# Patient Record
Sex: Female | Born: 1985 | Race: White | Hispanic: No | Marital: Single
Health system: Southern US, Community
[De-identification: ages and names within clinical notes are randomized; demographics above are authoritative.]

## PROBLEM LIST (undated history)

## (undated) HISTORY — PX: OVARY SURGERY: SHX727

## (undated) NOTE — Progress Notes (Signed)
 Formatting of this note is different from the original.  Subjective   Patient ID: Cheryl [name] is a 60 y.o. female presenting to the Urgent Care with a chief complaint of Vomiting (Patient states she is vomiting x 1 day ).    PT STATES THAT SHE HAS BEEN VOMITING FOR THE PAST FEW HOURS. PT HAS VOMITED 3 TIMES AND NEEDS A WORK EXCUSE.     Vomiting    Objective   Vitals:    03/06/24 1717   Weight: 109 kg (240 lb)   Height: 1.727 m (5' 8)     No results found.   Vital signs reviewed.    Physical Exam  HENT:      Head: Normocephalic.      Nose: Nose normal.   Eyes:      Extraocular Movements: Extraocular movements intact.   Musculoskeletal:      Cervical back: Normal range of motion.   Skin:     General: Skin is warm.   Neurological:      General: No focal deficit present.      Mental Status: She is alert.   Psychiatric:         Mood and Affect: Mood normal.     Telehealth Encounter:  This encounter was completed virtually using a secure, synchronous, interactive audio-video session via [employer] telehealth platform.  The physical exam was limited to observational findings.  Patient Location:  Patient at home or other non-healthcare setting in the state of State: [place]   Provider Location:  In clinic in the state of State: [place]   Care provided during virtual visit.  Patient or guardian consented to such, understanding limitations and alternatives.  Patient name, date of birth, and location were verified prior to visit.  Patient is currently in a location in which I currently hold an active license.  Follow-up with patient's provider or our clinic as needed for ongoing concerns.      Assessment & Plan  HAVE ADVISED PT TO GO TO THE ER FOR FURTHER EVALUATION.  Assessment & Plan        In-House Lab Results:     Results for orders placed or performed in visit on 01/19/24   POCT Strep    Collection Time: 01/19/24  4:42 PM   Result Value Ref Range    Rapid Strep A Screen Negative Negative   POCT Covid Antigen     Collection Time: 01/19/24  4:49 PM   Result Value Ref Range    POCT Covid Antigen Negative Negative       In-House Imaging Reads:        Procedure Documentation:  Procedures     ED Course & MDM   MDM - Medical Decision Making: Transfer to ED via private transport. Patient is medically stable for transport without medical supervision.   Electronically signed by Farrah Zahir, PA-C at 03/06/2024  5:41 PM EST

## (undated) NOTE — Progress Notes (Signed)
 Formatting of this note is different from the original.  Subjective   Patient ID: Cheryl [name] is a 76 y.o. female presenting to the Urgent Care virtually with a chief complaint of Eye Problem (X4 days redness both eyes ).    Burning, itching, watery discharge and redness of left eye for about 4 days. Similar symptoms in right eye today as well. Lump under left upper eyelid for 2 days. No injury or chemical exposure to either eye. No blurry vision or double vision. No foreign body sensation in either eye. No treatment so far.    Objective   Vitals:    03/27/24 1830   Weight: 109 kg (240 lb)  Comment: verbal   Height: 1.727 m (5' 8)  Comment: verbal   BMI (Calculated): 36.5 kg/m2   BSA (Calculated - sq m): 2.29 sq meters     OB Vitals  LMP 03/13/2024   OB Status Having periods     Social History     Tobacco Use   Smoking Status Never    Passive exposure: Never   Smokeless Tobacco Never       Vital signs reviewed.    Physical Exam  Constitutional:       General: She is not in acute distress.     Appearance: Normal appearance.   HENT:      Head: Normocephalic and atraumatic.   Eyes:      General:         Right eye: No discharge.         Left eye: Hordeolum (left upper eyelid swollen and red) present.No discharge.      Extraocular Movements: Extraocular movements intact.      Conjunctiva/sclera:      Right eye: Right conjunctiva is injected. No chemosis.     Left eye: Left conjunctiva is injected. No chemosis.  Pulmonary:      Effort: No respiratory distress.   Neurological:      Mental Status: She is alert and oriented to person, place, and time.      Cranial Nerves: No dysarthria or facial asymmetry.   Psychiatric:         Mood and Affect: Affect normal.         Speech: Speech normal.         Behavior: Behavior is cooperative.         Thought Content: Thought content normal.     Telehealth Encounter:  This encounter was completed virtually using a secure, synchronous, interactive audio-video session via [employer]  telehealth platform.  The physical exam was limited to observational findings.  Patient Location:  Patient at home or other non-healthcare setting in the state of State: [place]   Provider Location:  In clinic in the state of State: [place]   Care provided during virtual visit.  Patient or guardian consented to such, understanding limitations and alternatives.  Patient name, date of birth, and location were verified prior to visit.  Patient is currently in a location in which I currently hold an active license.  Follow-up with patient's provider or our clinic as needed for ongoing concerns.        Assessment & Plan    Other orders    trimethoprim-polymyxin b (Polytrim) ophthalmic solution; Administer 1 drop into both eyes every 6 hours for 7 days.    In-House Lab Results:     Results for orders placed or performed in visit on 01/19/24   POCT Strep    Collection Time: 01/19/24  4:42 PM   Result Value Ref Range    Rapid Strep A Screen Negative Negative   POCT Covid Antigen    Collection Time: 01/19/24  4:49 PM   Result Value Ref Range    POCT Covid Antigen Negative Negative       In-House Imaging Reads:        Procedure Documentation:  Procedures         ED Course & MDM   MDM - Medical Decision Making: Home with return precautions    Electronically signed by Lonni DELENA Potters, MD at 03/27/2024  6:48 PM EST

---
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
## 2004-12-30 ENCOUNTER — Emergency Department (HOSPITAL_COMMUNITY): Admission: EM | Admit: 2004-12-30 | Discharge: 2004-12-30 | Payer: Self-pay | Admitting: Emergency Medicine

## 2006-09-05 ENCOUNTER — Emergency Department (HOSPITAL_COMMUNITY): Admission: EM | Admit: 2006-09-05 | Discharge: 2006-09-05 | Payer: Self-pay | Admitting: Emergency Medicine

## 2007-04-18 ENCOUNTER — Emergency Department (HOSPITAL_COMMUNITY): Admission: EM | Admit: 2007-04-18 | Discharge: 2007-04-18 | Payer: Self-pay | Admitting: Emergency Medicine

## 2007-08-05 ENCOUNTER — Inpatient Hospital Stay (HOSPITAL_COMMUNITY): Admission: AD | Admit: 2007-08-05 | Discharge: 2007-08-10 | Payer: Self-pay | Admitting: Obstetrics and Gynecology

## 2008-03-24 ENCOUNTER — Emergency Department (HOSPITAL_COMMUNITY): Admission: EM | Admit: 2008-03-24 | Discharge: 2008-03-24 | Payer: Self-pay | Admitting: Emergency Medicine

## 2008-10-05 ENCOUNTER — Emergency Department (HOSPITAL_COMMUNITY): Admission: EM | Admit: 2008-10-05 | Discharge: 2008-10-05 | Payer: Self-pay | Admitting: Emergency Medicine

## 2009-02-25 ENCOUNTER — Emergency Department (HOSPITAL_COMMUNITY): Admission: EM | Admit: 2009-02-25 | Discharge: 2009-02-25 | Payer: Self-pay | Admitting: Emergency Medicine

## 2009-08-23 IMAGING — CR DG CHEST 2V
2 series · 2 of 2 positions shown · non-contrast
Comparison: None

CLINICAL DATA: Fever, cough and body aches.

CHEST - 2 VIEW

[w chest pa]
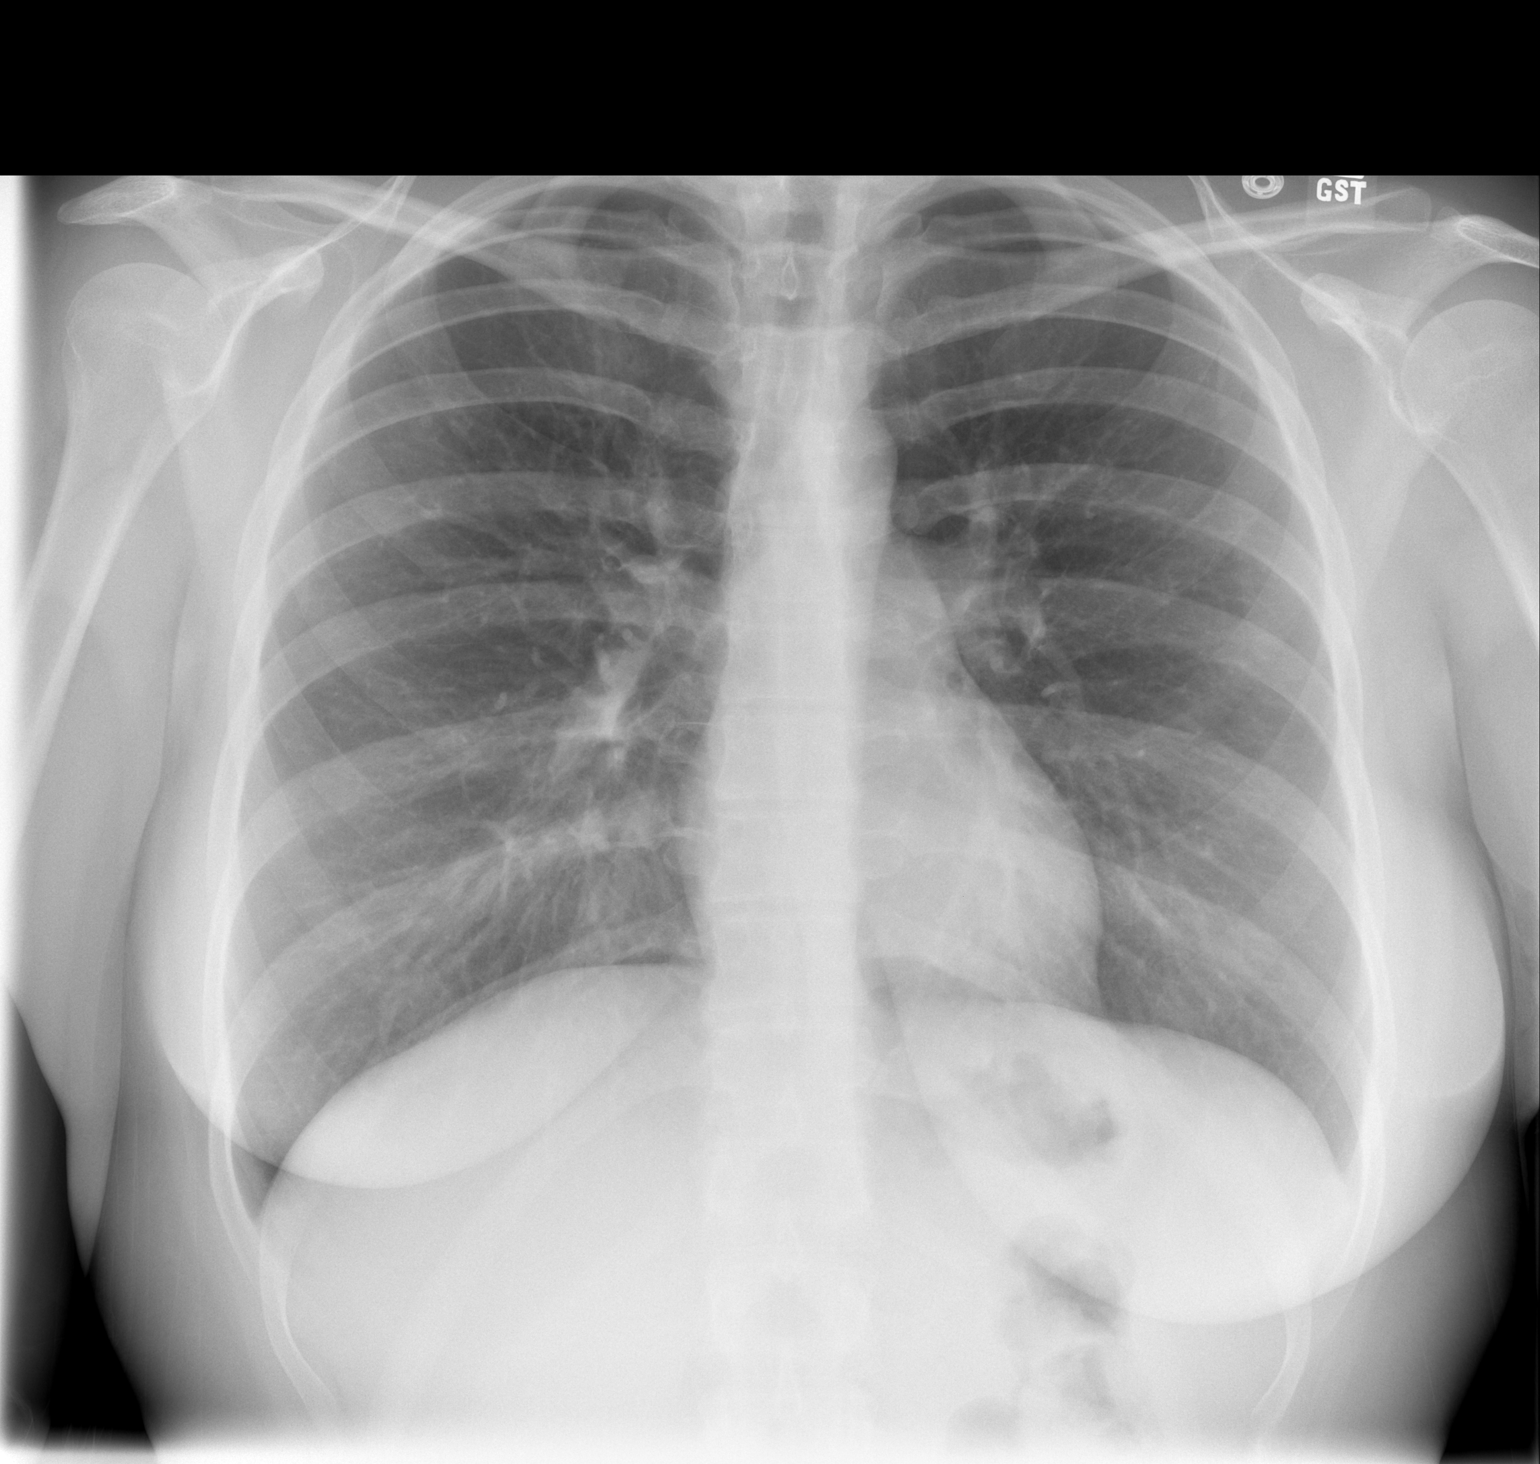

[w chest lat]
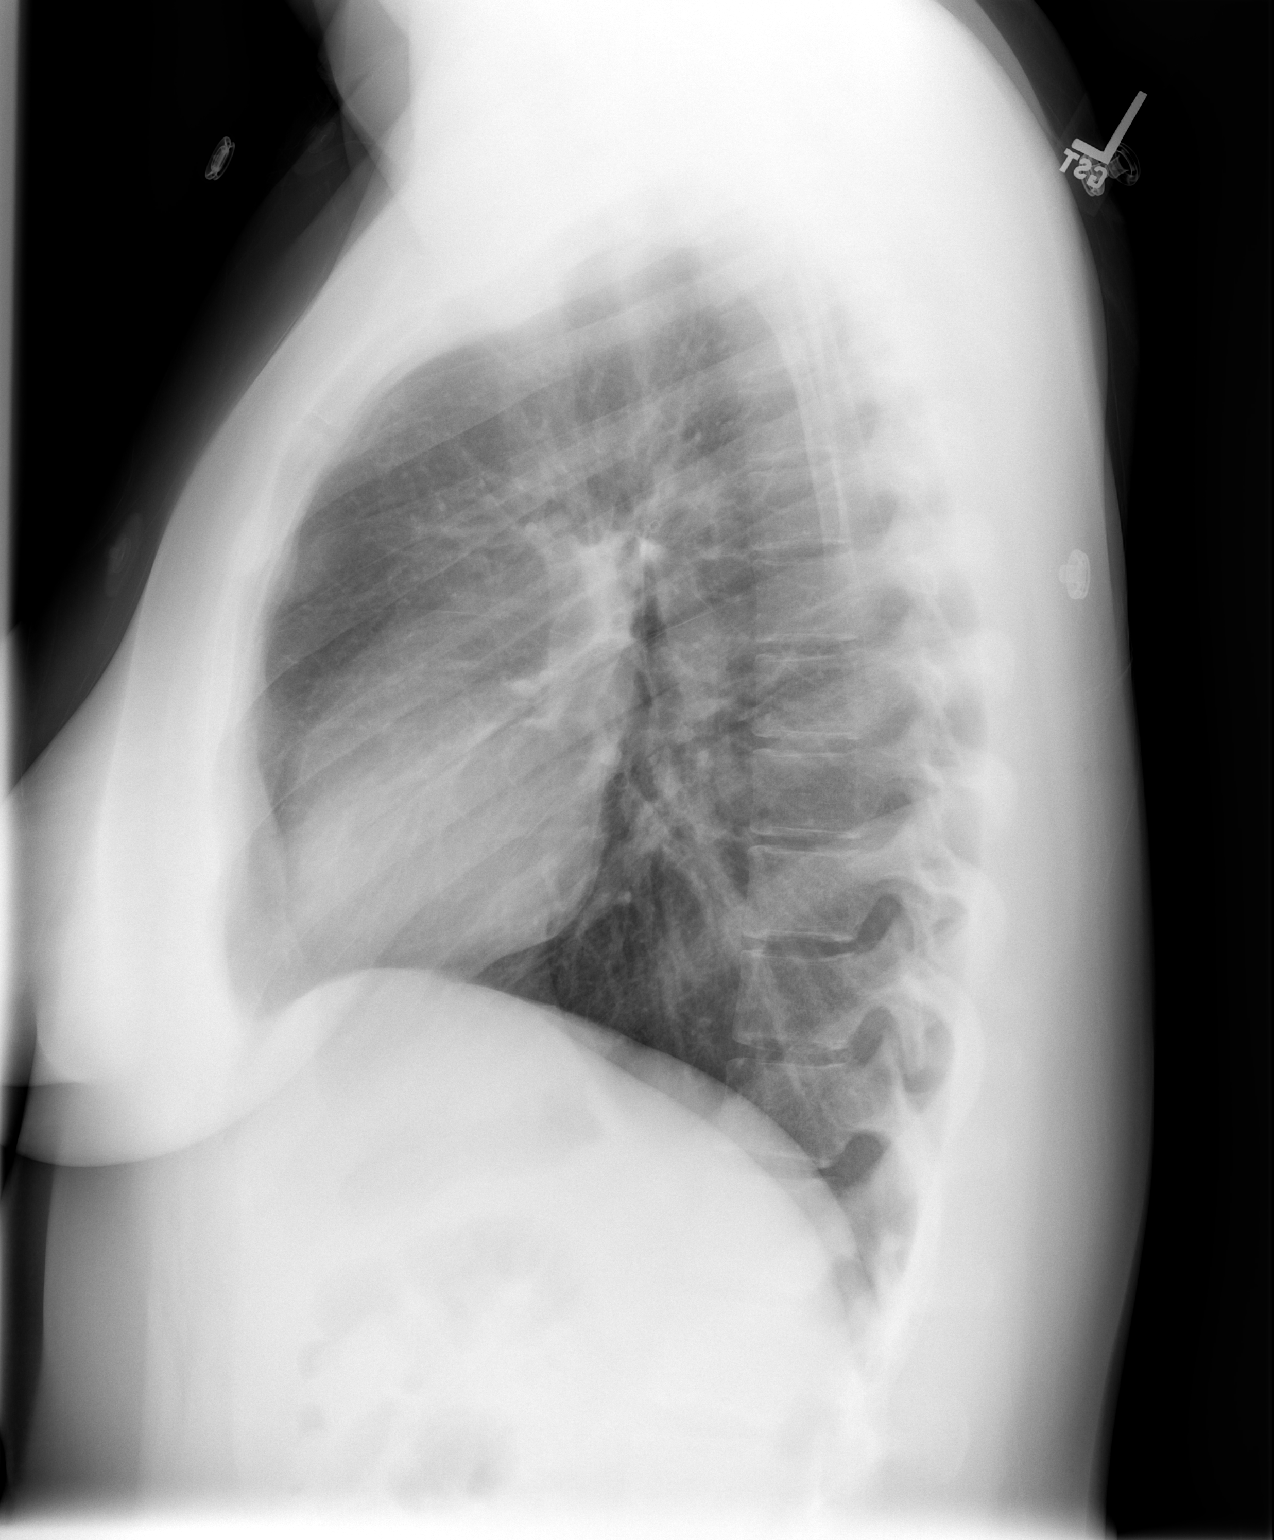

[2 of 2 positions shown; findings below may reference images not displayed]

FINDINGS: The cardiac silhouette, mediastinal and hilar contours
are within normal limits.  Minimal peribronchial thickening.  No
focal infiltrates.  No effusions.  The bony thorax is intact.
IMPRESSION: Minimal peribronchial thickening.  No focal pulmonary infiltrates.

## 2010-07-16 LAB — COMPREHENSIVE METABOLIC PANEL
ALT: 10 U/L (ref 0–35)
AST: 21 U/L (ref 0–37)
Albumin: 3.8 g/dL (ref 3.5–5.2)
Alkaline Phosphatase: 46 U/L (ref 39–117)
BUN: 12 mg/dL (ref 6–23)
CO2: 22 mEq/L (ref 19–32)
Calcium: 8.5 mg/dL (ref 8.4–10.5)
Chloride: 108 mEq/L (ref 96–112)
Creatinine, Ser: 0.76 mg/dL (ref 0.4–1.2)
GFR calc Af Amer: >60 mL/min (ref >60)
GFR calc non Af Amer: >60 mL/min (ref >60)
Glucose, Bld: 96 mg/dL (ref 70–99)
Potassium: 3.9 mEq/L (ref 3.5–5.1)
Sodium: 138 mEq/L (ref 135–145)
Total Bilirubin: 1.1 mg/dL (ref 0.3–1.2)
Total Protein: 6.5 g/dL (ref 6.0–8.3)

## 2010-07-16 LAB — DIFFERENTIAL
Basophils Absolute: 0 10*3/uL (ref 0.0–0.1)
Basophils Relative: 0 % (ref 0–1)
Eosinophils Absolute: 0.1 10*3/uL (ref 0.0–0.7)
Eosinophils Relative: 1 % (ref 0–5)
Lymphocytes Relative: 24 % (ref 12–46)
Lymphs Abs: 2 10*3/uL (ref 0.7–4.0)
Monocytes Absolute: 0.8 10*3/uL (ref 0.1–1.0)
Monocytes Relative: 9 % (ref 3–12)
Neutro Abs: 5.7 10*3/uL (ref 1.7–7.7)
Neutrophils Relative %: 67 % (ref 43–77)

## 2010-07-16 LAB — LIPASE, BLOOD: Lipase: 16 U/L (ref 11–59)

## 2010-07-16 LAB — URINALYSIS, ROUTINE W REFLEX MICROSCOPIC
Bilirubin Urine: NEGATIVE
Glucose, UA: NEGATIVE mg/dL
Hgb urine dipstick: NEGATIVE
Ketones, ur: 15 mg/dL — AB
Nitrite: NEGATIVE
Protein, ur: NEGATIVE mg/dL
Specific Gravity, Urine: 1.023 (ref 1.005–1.030)
Urobilinogen, UA: 1 mg/dL (ref 0.0–1.0)
pH: 6 (ref 5.0–8.0)

## 2010-07-16 LAB — CBC
HCT: 40.5 % (ref 36.0–46.0)
Hemoglobin: 13.8 g/dL (ref 12.0–15.0)
MCHC: 34 g/dL (ref 30.0–36.0)
MCV: 87.4 fL (ref 78.0–100.0)
Platelets: 191 10*3/uL (ref 150–400)
RBC: 4.64 MIL/uL (ref 3.87–5.11)
RDW: 13.4 % (ref 11.5–15.5)
WBC: 8.5 10*3/uL (ref 4.0–10.5)

## 2010-07-27 IMAGING — CR DG CHEST 2V
2 series · 2 of 2 positions shown · non-contrast
Comparison: 03/24/2008

CLINICAL DATA: Chest pain.

CHEST - 2 VIEW

[w chest pa]
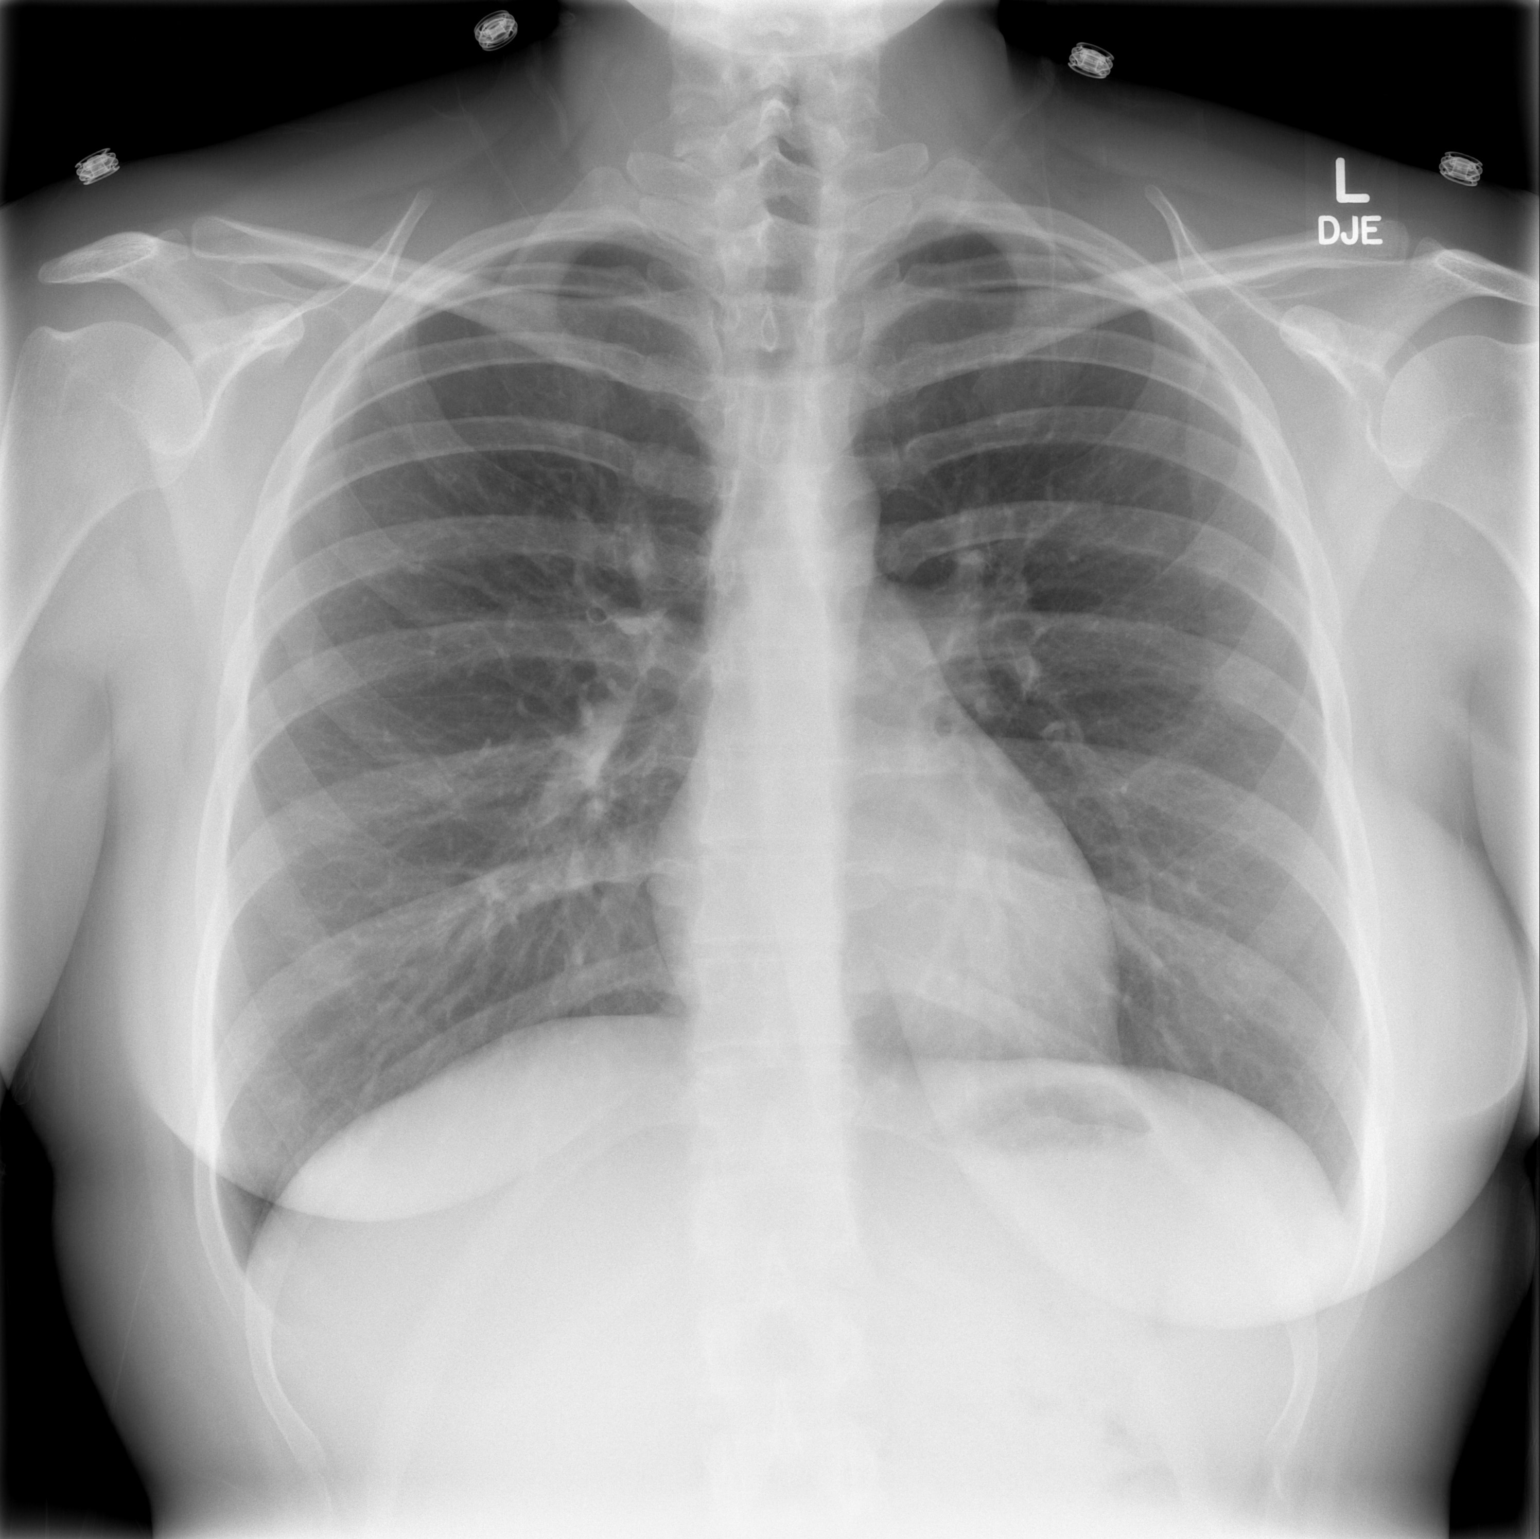

[w chest lat]
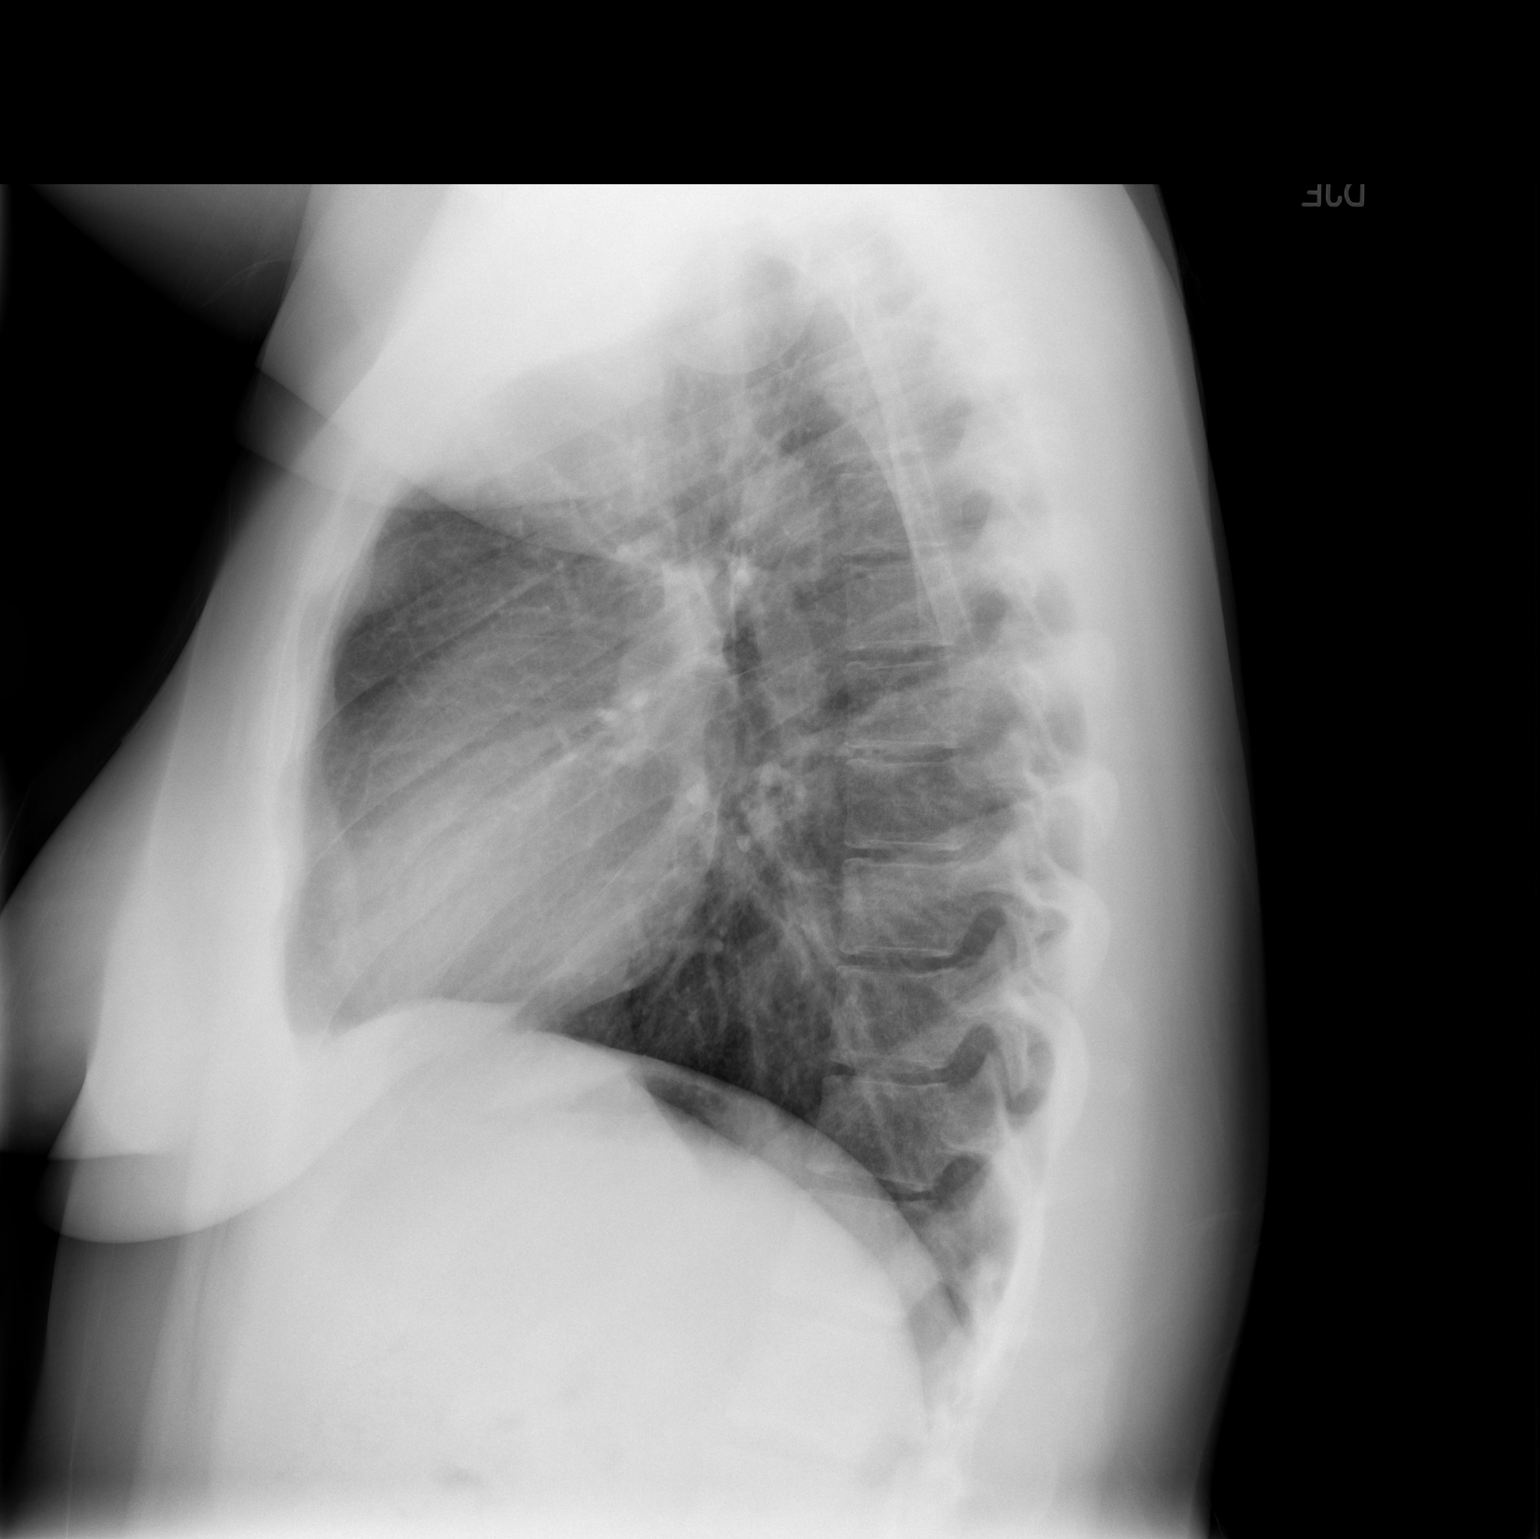

[2 of 2 positions shown; findings below may reference images not displayed]

FINDINGS: Heart size is normal.  The mediastinum is unremarkable.
Lungs are clear except for a small granuloma in the right upper
lobe has not changed.  No infiltrate, mass, effusion or collapse.
No bony abnormality.
IMPRESSION: Normal except for a small granuloma in the right upper lobe,
unchanged since 6336.  No active disease.

## 2010-08-21 NOTE — Op Note (Signed)
[name], [name]                 ACCOUNT NO.:  [id number]   MEDICAL RECORD NO.:  [id number]           PATIENT TYPE:   LOCATION:                                 FACILITY:   PHYSICIAN:  Hal Morales, M.D.DATE OF BIRTH:  1986/01/11   DATE OF PROCEDURE:  08/07/2007  DATE OF DISCHARGE:                               OPERATIVE REPORT   PREOPERATIVE DIAGNOSES:  Intrauterine pregnancy at term and failure to  progress in labor.   POSTOPERATIVE DIAGNOSES:  Intrauterine pregnancy at term and failure to  progress in labor plus meconium-stained amniotic fluid and fetal  macrosomia.   PROCEDURE:  Primary low-transverse cesarean section.   SURGEON:  Hal Morales, MD   FIRST ASSISTANT:  Elby Showers. Mayford Knife, certified nurse midwife.   ANESTHESIA:  Epidural.   ESTIMATED BLOOD LOSS:  750 mL.   COMPLICATIONS:  None.   FINDINGS:  The patient was delivered of a female infant, weighing 9 pounds  3 ounces with Apgars of 7 and 9 at 1 and 5 minutes respectively.  The  uterus was normal for the gravid state.   PROCEDURE:  The patient was taken to the operating room after  appropriate identification and placed on the operating table.  She had  her labor epidural and Foley catheter in place.  The labor epidural was  dosed for surgical anesthesia.  She was placed in a supine position with  a left lateral tilt.  The abdomen was prepped with multiple layers of  Betadine and draped as a sterile field.  After assurance of adequate  surgical anesthesia, the suprapubic region was infiltrated with 20 mL of  0.25% Marcaine.  A transverse incision was made in the abdomen and the  abdomen was opened in layers.  The peritoneum was entered and the  bladder blade was placed.  The uterus was incised approximately 2 cm  above the uterovesical fold and that incision was taken laterally on  either side bluntly.  The infant was delivered from the occiput  transverse position with the aid of a Kiwi vacuum  extractor and after  having the nares and pharynx suctioned, then the cord clamped and cut  and was handed off to the awaiting pediatricians.  The uterus was  massaged and the placenta was spontaneously removed.  This was handed  off to employees from [employer] [employer] for cord blood banking.  The  uterine cavity was cleared off products of conception with a moist  laparotomy pad.  The uterine incision was closed with a running  interlocking suture of 0 Vicryl.  An imbricating suture of 0 Vicryl was  placed.  Hemostasis was noted to be adequate.  Copious irrigation was  carried out and the abdominal peritoneum was closed with a running  suture of 2-0 Vicryl.  The rectus muscles were reapproximated in the  midline with a figure-of-eight suture of 2-0 Vicryl.  The fascia was  closed with a running suture of 0 Vicryl and reinforced on either side  of midline with figure-of-eight sutures of 0 Vicryl.  The subcutaneous  tissue was copiously  irrigated and a subcutaneous drain was placed  through a stab incision in the left lower quadrant.  It was sewn in with  a suture of 0 silk.  The skin incision was closed with a subcuticular  suture of 3-  0 Monocryl.  A sterile dressing was applied.  The patient was taken from  the operating room to the recovery room in satisfactory condition having  tolerated the procedure well with sponge and instrument counts correct.  The placenta went to birthing suite.  The infant went to the full-term  nursery.      Hal Morales, M.D.  Electronically Signed     VPH/MEDQ  D:  08/07/2007  T:  08/07/2007  Job:  [id number]

## 2010-08-21 NOTE — Discharge Summary (Signed)
[name], [name]                 ACCOUNT NO.:  [id number]   MEDICAL RECORD NO.:  [id number]          PATIENT TYPE:  INP   LOCATION:  9146                          [facility]   PHYSICIAN:  Crist Fat. Rivard, M.D. DATE OF BIRTH:  07-27-1985   DATE OF ADMISSION:  08/05/2007  DATE OF DISCHARGE:  08/10/2007                               DISCHARGE SUMMARY   ADMITTING DIAGNOSES:  1. Intrauterine pregnancy at 39-6/7th weeks.  2. Early labor.  3. Negative group B strep   DISCHARGE DIAGNOSES:  1. Intrauterine pregnancy at term.  2. Failure to progress.  3. Meconium-stained fluid.  4. Fetal macrosomia.   PROCEDURES:  1. Primary low transverse cesarean section.  2. Epidural anesthesia.   HOSPITAL COURSE:  Ms. Guerrero is a 25 year old gravida 1, para 0 at 1-  6/7th weeks, who presented with contractions late in the afternoon of  August 05, 2007.  Pregnancy had been remarkable for;  1. History of goiter.  2. Elevated BMI.  3. Elevated Glucola with 3-hour GTT.  4. Group B strep negative.  On admission, cervix was 1-1/2, 80% vertex and -2.  Uterine retractions  were every 2-3 minutes.  Fetal heart rate was reassuring, but  nonreactive.  The patient was given Ancef and Phenergan for early labor  analgesia.  She had a decel.  Cervix at that time was 2, 80, and -2.  She was therefore admitted for full labor care.  By the morning of August 06, 2007, the patient did rest, through the night she received two doses  of Stadol.  At that time, cervix was 3, 80% vertex, -2 with some show.  Fetal heart rate was reactive.  Uterine contractions were approximately  every 5 minutes, although they were very difficult to trace.  The  patient was very uncomfortable.  Plan was made to place the epidural and  then plan artificial rupture of membranes for further labor  augmentation.  Epidural was placed.  Artificial rupture of membranes was  accomplished with light meconium-stained fluid noted at  approximately 12  noon.  Intrauterine pressure catheter and fetal scalp leads were  applied.  There were two mild variables just after rupture.  At that  time cervix was 3, 90% vertex, at -1 to -3 station with bulging bag of  water.  Variability was noted with the internal monitor.  After  approximately 1-1/2 hours, there was no change in her contractions.  Pitocin was begun.  She did have one decel times 3 minutes.  Just after  an acceleration cervix at that time was 3-4, 70% vertex at -1.  By 6:30  that night, cervix had progressed to 5, 100% vertex at +1, but there was  largely caput noted.  The most of the vertex was at -1 station.  Montevideos were 200-630, but the pattern had been somewhat irregular  with Pitocin on low value.  By 8:30, she had had adequate labor for 2-3  hours.  Cervix was unchanged and the molding was noted.  The decision  was made to observe for a little bit longer,  but no change occurred in  the cervix in 4 hours.  Dr. Pennie Rushing was consulted.  Decisions were made  to proceed with C-section, after risks and benefits of procedure were  discussed.  Diagnosis was arrest of active phase of labor.   The patient was taken to the operating room where a primary low  transverse cesarean section was performed by Dr. Pennie Rushing under epidural  anesthesia.  Findings were of a viable female, weight 9 pounds 3 ounces  with Apgars of 7 and 9.  The patient had a two-layer closure.  Infant  was taken to the full-term nursery.  Mother was taken to recovery in  good condition.  By postop day #1, the patient was doing well.  She had  a JP drain, was draining a small amount.  Her hemoglobin was 10.  She  did have slight tachycardia, but was asymptomatic.  The rest of her  hospital stay was uncomplicated.  She did want to breast feed, although  partner was not supportive of this.  The patient did elect to proceed  with breast feeding at this time.  By postop day #3, patient is doing  well.   She was ready to go home.  She was awaiting circumcision.  Her  incision was clean, dry, and intact.  Her JP was draining a minimum  amount.  It was removed without difficulty.  The rest of physical exam  was within normal limits.  She was undecided about contraception.  She  was deemed to receive full benefit of hospital stay and was discharged  to home.   DISCHARGE INSTRUCTIONS:  Per [facility] handout.   DISCHARGE MEDICATIONS:  1. Motrin 600 mg p.o. q.6 h. p.r.n. pain.  2. Percocet 5/325 1-2 p.o. q.3-4 h. p.r.n. pain.  3. Prenatal vitamin one p.o. daily.   DISCHARGE FOLLOW-UP:  Follow-up will occur in 6 weeks at [facility] or p.r.n.      [name], C.N.M.      Crist Fat Rivard, M.D.  Electronically Signed    VLL/MEDQ  D:  08/10/2007  T:  08/10/2007  Job:  [id number]

## 2010-08-21 NOTE — H&P (Signed)
[name], [name]                 ACCOUNT NO.:  [id number]   MEDICAL RECORD NO.:  [id number]          PATIENT TYPE:  INP   LOCATION:  9169                          [facility]   PHYSICIAN:  Naima A. Dillard, M.D. DATE OF BIRTH:  28-Jun-1985   DATE OF ADMISSION:  08/05/2007  DATE OF DISCHARGE:                              HISTORY & PHYSICAL   This is a 25 year old gravida 1, para 0 at 39-6/7 weeks, who presents  with contractions since 2:00 p.m. and pink show.  She reports positive  fetal movement, denies leaking.  The pregnancy has been followed by  another office previously, and the patient transferred to [facility] at 32-6/7 weeks.  Pregnancy is remarkable for history of  goiter, elevated BMI, elevated Glucola with normal 3-hour GTT, and group  B strep negative.   ALLERGIES:  None.   OB HISTORY:  None.   MEDICAL HISTORY:  1. Remarkable for childhood varicella.  2. History of goiter.  3. Hypothyroidism.   SURGICAL HISTORY:  Negative.   FAMILY HISTORY:  Remarkable for mother with hypertension, diabetes.  Sisters with diabetes.  Mother with thyroidectomy.  Sister with  hyperthyroidism, and sisters with seizures and bipolar.   GENETIC HISTORY:  Negative.   SOCIAL HISTORY:  The patient is single. Father of the baby is, Rogene Houston, is involved and supportive.  She does not report a religious  affiliation.  She denies any alcohol, tobacco, or drug use.   PRENATAL LABS:  Hemoglobin 13.2, platelets 207.  Blood type A positive,  antibody screen negative, RPR nonreactive, rubella immune.  Hepatitis  negative, HIV negative.  Pap test normal.  Gonorrhea negative, Chlamydia  negative.  History of current pregnancy.  The patient entered care at  another practice and transferred to [facility] at 32-6/7 weeks.  She reports previous pregnancy care is uneventful.  She had an elevated  glucola at 142 and had a 3-hour GTT, which was entirely normal and a  group B strep  that was negative at term, and she presents today.   OBJECTIVE DATA:  VITAL SIGNS:  Stable, afebrile.  HEENT:  Within normal limits.  NECK:  Thyroid slightly enlarged.  CHEST:  Clear to auscultation.  HEART:  Regular rate and rhythm.  ABDOMEN:  Gravid.  Vertex Leopold's exam shows fetal heart rate 150s  with 10-beat accelerations, nonreactive for greater than 1 hour.  Uterine contractions every 2-3 minutes.  Cervix is 1.5 cm, 80% effaced,  -2 station with vertex presentation with no change after 1 hour.  EXTREMITIES:  Within normal limits.   ASSESSMENT:  1. Intrauterine pregnancy at 39-6/7 weeks.  2. Latent labor.  3. Nonreassuring fetal heart rate.   PLAN:  1. Admitted for 23-hour observe per Dr. Normand Sloop.  2. Morphine and Phenergan analgesia.  3. We will reevaluate after several hours.      Marie L. Williams, C.N.M.      Naima A. Normand Sloop, M.D.  Electronically Signed    MLW/MEDQ  D:  08/05/2007  T:  08/06/2007  Job:  [id number]

## 2010-12-27 LAB — CBC
HCT: 35.1 — ABNORMAL LOW
MCV: 88.2
Platelets: 231
RDW: 13.4
WBC: 13.8 — ABNORMAL HIGH

## 2010-12-27 LAB — URINE MICROSCOPIC-ADD ON

## 2010-12-27 LAB — DIFFERENTIAL
Basophils Absolute: 0
Basophils Relative: 0
Eosinophils Absolute: 0.1
Eosinophils Relative: 1
Lymphs Abs: 2.3
Neutrophils Relative %: 75

## 2010-12-27 LAB — URINALYSIS, ROUTINE W REFLEX MICROSCOPIC
Glucose, UA: NEGATIVE
Protein, ur: NEGATIVE
Specific Gravity, Urine: 1.023
Urobilinogen, UA: 0.2

## 2010-12-27 LAB — ABO/RH: ABO/RH(D): A POS

## 2010-12-27 LAB — WET PREP, GENITAL: Yeast Wet Prep HPF POC: NONE SEEN

## 2011-01-01 LAB — CBC
HCT: 37
Hemoglobin: 12.8
MCHC: 34.5
MCV: 89.1
RBC: 4.16
WBC: 17.7 — ABNORMAL HIGH

## 2011-01-01 LAB — RPR: RPR Ser Ql: NONREACTIVE

## 2011-01-11 LAB — URINALYSIS, ROUTINE W REFLEX MICROSCOPIC
Bilirubin Urine: NEGATIVE
Glucose, UA: NEGATIVE mg/dL
Ketones, ur: >80 mg/dL — AB
Nitrite: NEGATIVE
Specific Gravity, Urine: 1.022 (ref 1.005–1.030)
pH: 5.5 (ref 5.0–8.0)

## 2011-01-11 LAB — POCT I-STAT, CHEM 8
Creatinine, Ser: 1 mg/dL (ref 0.4–1.2)
Glucose, Bld: 84 mg/dL (ref 70–99)
HCT: 45 % (ref 36.0–46.0)
Hemoglobin: 15.3 g/dL — ABNORMAL HIGH (ref 12.0–15.0)
Sodium: 138 mEq/L (ref 135–145)
TCO2: 20 mmol/L (ref 0–100)

## 2011-01-11 LAB — URINE CULTURE: Colony Count: 5000

## 2018-09-23 ENCOUNTER — Other Ambulatory Visit: Payer: Self-pay

## 2018-09-23 ENCOUNTER — Encounter (HOSPITAL_COMMUNITY): Payer: Self-pay

## 2018-09-23 DIAGNOSIS — W450XXA Nail entering through skin, initial encounter: Secondary | ICD-10-CM | POA: Insufficient documentation

## 2018-09-23 DIAGNOSIS — H9203 Otalgia, bilateral: Secondary | ICD-10-CM | POA: Insufficient documentation

## 2018-09-23 DIAGNOSIS — Z5321 Procedure and treatment not carried out due to patient leaving prior to being seen by health care provider: Secondary | ICD-10-CM | POA: Insufficient documentation

## 2018-09-23 NOTE — ED Notes (Signed)
Patient answered on 3rd call.

## 2018-09-23 NOTE — ED Triage Notes (Signed)
Pt reports bilateral otalgia x 1 week. She also thinks that she has an abscess in her R lower gumline. Lastly, she would like a tetanus shot because she stepped on a rusty nail 4 weeks ago. A&Ox4. Ambulatory.

## 2018-09-24 ENCOUNTER — Emergency Department (HOSPITAL_COMMUNITY)
Admission: EM | Admit: 2018-09-24 | Discharge: 2018-09-24 | Disposition: A | Discharge status: Home / Self Care | Payer: Self-pay | Attending: Emergency Medicine | Admitting: Emergency Medicine

## 2019-06-24 ENCOUNTER — Other Ambulatory Visit: Payer: Self-pay

## 2019-06-24 ENCOUNTER — Emergency Department
Admission: EM | Admit: 2019-06-24 | Discharge: 2019-06-24 | Disposition: A | Discharge status: Home / Self Care | Payer: MEDICAID | Attending: Emergency Medicine | Admitting: Emergency Medicine

## 2019-06-24 ENCOUNTER — Encounter (HOSPITAL_COMMUNITY): Payer: Self-pay

## 2019-06-24 DIAGNOSIS — Z029 Encounter for administrative examinations, unspecified: Secondary | ICD-10-CM

## 2019-06-24 DIAGNOSIS — Z1389 Encounter for screening for other disorder: Secondary | ICD-10-CM | POA: Insufficient documentation

## 2019-06-24 DIAGNOSIS — Z139 Encounter for screening, unspecified: Secondary | ICD-10-CM

## 2019-06-24 NOTE — ED Nurses Note (Signed)
Per [facility], pt was hitting self, "meowing like a cat", voices telling her to leave. Denies si/hi and hall. A&O, no obvious distress. Does not want to be here. [facility] made aware of possible dc

## 2019-06-24 NOTE — ED Triage Notes (Signed)
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

## 2019-06-24 NOTE — Discharge Instructions (Signed)
You have been seen in the [facility] today.    Please return to the closest emergency department should you have shortness of breath, chest pain, abdominal pain, headache, slurred speech, weak arm or leg, passing out, unable to eat, unable walk    Call 911 in an emergency.      Always make an appointment with your primary care doctor after being seen in the emergency department ASAP.

## 2019-06-24 NOTE — ED Provider Notes (Signed)
Emergency Department  Provider Note  HPI - 06/24/2019  COVID 19 Pandemic in Effect  Name: Cheryl [name]  Age and Gender: 34 y.o. female  PCP/Specialist Provider: N/A  Attending: Dr. Sebastian Ache     HISTORY OF PRESENT ILLNESS    Initial evaluation occurred at: 1154  History provided by: patient    Primary complaint: medical screening    Cheryl [name] is a 34 y.o. female who presents to the ED today for medical screening. Pt reports that she was in detox at [facility] when she was found outside throwing rocks at her car and brought in for erratic behavior. She states that she was trying to get into her car and that she was denied access to the key. She denies SI, HI, and all other sxs or complaints at this time. Pt does not want to be seen or evaluated at this time.    Allergies: NKDA    Location: Psych  Quality: Med screening  Onset: PTA  Context: see HPI  Modifying factors: None noted   Associated symptoms: (+)Med screening      Review of Systems:     Constitutional: +Med screening. No fever, chills or fatigue.   Skin: No rashes, lesions or itching.   HENT: No head injury.   Cardio: No chest pain, palpitations.   Respiratory: No cough, wheezing, SOB.  GI: No abdominal pain. No nausea/vomiting. No diarrhea, constipation.   MSK/Extremities: No joint pain. No neck pain. No back pain.  Neuro: No headache. No dizziness, weakness, paresthesia, LOC.  Psych: No SI, HI or substance abuse.   All other systems reviewed and are negative, unless commented on in the HPI.        PATIENT HISTORY  No current outpatient medications on file.       No Known Allergies    Past Medical History:  No past medical history on file.    Past Surgical History:  Past Surgical History:   Procedure Laterality Date   . Hx cesarean section     . Ovary surgery         Social History:  Social History     Tobacco Use   . Smoking status: Unknown If Ever Smoked   Substance Use Topics   . Alcohol use: Never   . Drug use: Yes     Types: Heroin,  Amphetamine     Social History     Substance and Sexual Activity   Drug Use Yes   . Types: Heroin, Amphetamine       Family History:  No family history on file.      Old records reviewed.      OBJECTIVE EXAM    Filed Vitals:    06/24/19 1141   BP: 129/88   Pulse: 95   Resp: 18   Temp: 36.6 C (97.9 F)   SpO2: 100%       Physical Exam  Nursing note and vitals reviewed.  Vital signs reviewed as above.     Constitutional: Patient is well developed.   Psych: In no acute distress or obvious discomfort. Has a normal mood and affect.   HENT: Head atraumatic. Airway patent  Eyes: Conjunctivae are normal.   Cardio: Slow regular rhythm.   Resp/Chest: Normal BS BL with no distress   GI: Abdomen soft and nondistended. No tenderness, rebound, or guarding.   Back/MSK: Full ROM in all 4 extremities.  Neuro: Patient is alert and oriented x3.    Skin: Skin is  warm, dry, and intact.     PLAN AND MEDICAL DECISION MAKING    Plan: Appropriate diagnostics ordered. Medical Records reviewed.    MDM:   . During the patient's stay in the emergency department, the above listed testing and diagnostics were performed to assist with medical decision making and were reviewed by myself. The results of the above were discussed and explained to patient/family.    Patient remained stable throughout the emergency department course.     Patient was instructed to follow up with PCP and any specialists as needed.    Patient was advised of when to return to the ED, such as new or worsening sxs.    Patient was given the opportunity to ask questions. All questions answered and patient expressed understanding.    Patient demonstrated understanding and is agreeable to plan of discharge with appropriate follow up.    The patient verbalized understanding of all instructions and had no further questions or concerns.     ED Course as of Jun 24 1199   Thu Jun 24, 2019   1200 Patient well appearing  No SI, no HI  Cooperative  A/Ox 3  Steady gait  Linear  thought process  Tolerates PO     [DF]      ED Course User Index  [DF] Funsch, Phylliss Blakes., MD       Impression:   Diagnosis     Diagnosis Comment Added By Time Added    Encounter for medical screening examination  Funsch, Phylliss Blakes., MD 06/24/2019 12:01 PM        Disposition:  Discharged    I am scribing for, and in the presence of, Dr. Belinda Block for services provided on 06/24/2019.  Teressa Lower, [place], [place]  06/24/2019, 12:01    Ciro Backer., MD  06/24/2019, 17:16      I personally performed the services described in this documentation, as scribed  in my presence, and it is both accurate  and complete.    Ciro Backer., MD

## 2019-11-06 ENCOUNTER — Ambulatory Visit (HOSPITAL_COMMUNITY): Payer: Self-pay | Admitting: Addiction Medicine

## 2021-07-03 ENCOUNTER — Ambulatory Visit (INDEPENDENT_AMBULATORY_CARE_PROVIDER_SITE_OTHER): Payer: Self-pay

## 2021-07-03 NOTE — Telephone Encounter (Signed)
CM returned phone call and spoke with patient.  Patient scheduled for Addiction Services intake on 07/10/2021 at 8:30am at [facility] location.    Edmonia Caprio, CASE MANAGER  07/03/2021, 10:30

## 2021-07-03 NOTE — Telephone Encounter (Signed)
-----   Message from Grandville Silos sent at 07/02/2021 12:22 PM EDT -----  Pt is wanting to schedule an intake for our COAT clinic. Thank you.    [employer]

## 2021-07-10 ENCOUNTER — Encounter (INDEPENDENT_AMBULATORY_CARE_PROVIDER_SITE_OTHER): Payer: Self-pay | Admitting: Social Worker

## 2021-07-10 ENCOUNTER — Telehealth (HOSPITAL_COMMUNITY): Payer: Self-pay

## 2021-07-10 ENCOUNTER — Ambulatory Visit (INDEPENDENT_AMBULATORY_CARE_PROVIDER_SITE_OTHER): Payer: MEDICAID | Admitting: Social Worker

## 2021-07-10 ENCOUNTER — Other Ambulatory Visit: Payer: Self-pay

## 2021-07-10 ENCOUNTER — Ambulatory Visit (INDEPENDENT_AMBULATORY_CARE_PROVIDER_SITE_OTHER): Payer: MEDICAID | Admitting: Emergency Medicine

## 2021-07-10 ENCOUNTER — Other Ambulatory Visit: Payer: MEDICAID | Attending: Emergency Medicine | Admitting: Emergency Medicine

## 2021-07-10 DIAGNOSIS — F1120 Opioid dependence, uncomplicated: Secondary | ICD-10-CM

## 2021-07-10 DIAGNOSIS — Z0000 Encounter for general adult medical examination without abnormal findings: Secondary | ICD-10-CM

## 2021-07-10 DIAGNOSIS — F419 Anxiety disorder, unspecified: Secondary | ICD-10-CM

## 2021-07-10 DIAGNOSIS — F319 Bipolar disorder, unspecified: Secondary | ICD-10-CM

## 2021-07-10 DIAGNOSIS — F1520 Other stimulant dependence, uncomplicated: Secondary | ICD-10-CM

## 2021-07-10 LAB — CREATININE URINE, RANDOM: CREATININE RANDOM URINE: 167 mg/dL — ABNORMAL HIGH (ref 50–100)

## 2021-07-10 MED ORDER — BUPRENORPHINE 8 MG-NALOXONE 2 MG SUBLINGUAL FILM
1.0000 | ORAL_FILM | Freq: Two times a day (BID) | SUBLINGUAL | 0 refills | Status: DC
Start: 2021-07-10 — End: 2021-07-17

## 2021-07-10 MED ORDER — CLONIDINE HCL 0.1 MG TABLET
0.1000 mg | ORAL_TABLET | Freq: Three times a day (TID) | ORAL | 0 refills | Status: AC | PRN
Start: 2021-07-10 — End: ?

## 2021-07-10 NOTE — Progress Notes (Signed)
[place] Controlled Substance Full Name Report Report Date 07/10/2021   From 01/10/2020 To 07/09/2021 Date of Birth 1986/01/17 Prescription Count 12   Last Name [place] First Name [place] Name                      Patients included in report that appear to match the search criteria.   Last Name First Name Middle Name Gender Address   Cheryl [name] , [place], [place], [address]            Prescriber Name Prescriber [place] Name [place] Zip Rx Written Date Rx Dispense Date  & Date Sold   Rx Number Product Name MEDD Status Strength Qty Days # of Refill Sched Payment Type   Cheryl [name] [address], [place], [place]   Cheryl Le., Fnp-Bc [id number] [employer] [id number] 06/25/2021 06/25/2021 06/25/2021 [id number] Suboxone Film INACTIVE 8 mg/1; 2 mg/1 28 14  0/0 CIII Medicaid   Cheryl Le., Fnp-Bc [id number] [employer] [id number] 06/11/2021 06/11/2021 06/11/2021 [id number] Suboxone Film INACTIVE 8 mg/1; 2 mg/1 28 14  0/0 CIII Medicaid   Cheryl Le., Fnp-Bc [id number] [phone] [employer] [facility] [id number] 05/29/2021 05/29/2021 05/29/2021 [id number] Suboxone Film INACTIVE 8 mg/1; 2 mg/1 30 15  0/0 CIII Medicaid   Cheryl [name] [id number] [employer] [facility] [id number] 05/16/2021 05/17/2021 05/17/2021 [id number] Suboxone Film INACTIVE 8 mg/1; 2 mg/1 28 14  0/0 CIII Medicaid   Cheryl [name] [id number] [employer] [facility] [id number] 05/10/2021 05/14/2021 05/14/2021 [id number] Suboxone Film INACTIVE 8 mg/1; 2 mg/1 6 3  0/0 CIII Medicaid   Cheryl Le., Fnp-Bc [id number] [employer] [facility] [id number] 04/30/2021 05/01/2021 05/01/2021 [id number] Suboxone Film INACTIVE 8 mg/1; 2 mg/1 28 14  0/0 CIII Medicaid   Cheryl Le., Fnp-Bc [id number] [phone] [employer] [id number] 04/30/2021 04/30/2021 04/30/2021 [id number] Lorazepam INACTIVE 1 mg/1 19.500 12 0/0 CIV Medicaid   Cheryl [name] [id number] [place] [id number] 04/20/2021 04/23/2021 04/23/2021 [id number] Suboxone INACTIVE 12 mg/1, 3 mg/1 7 7  0/0 CIII Medicaid   Cheryl [name] [id number] [address] [facility] [id number] 04/10/2021 04/10/2021 04/10/2021 [id number] Suboxone INACTIVE 12 mg/1, 3 mg/1 14 14  0/0 CIII Medicaid   Cheryl [name] [id number]  [place]. [id number] 04/04/2021 04/04/2021 04/04/2021 [id number] Suboxone Film INACTIVE 8 mg/1; 2 mg/1 10 10  0/0 CIII Medicaid   Cheryl [name] [id number] 26301 [employer] [facility] [id number] 03/29/2021 03/29/2021 03/29/2021 [id number] Suboxone Film INACTIVE 8 mg/1; 2 mg/1 7 7  0/0 CIII Medicaid   Cheryl [name], Cheryl [name] [id number] 10/17/2020 10/17/2020 10/17/2020 [id number] Suboxone INACTIVE 4 MG 14 7 0/2 CIII Medicaid             Suspected Overdose Older Than 12 Months - As reported by [place] service providers (EMS, ER, etc.)   Medical Service Provider Location Date of [place]  ZIP Code: [id number] June 02, 2020   "If data appears in the text box immediately above, information has been reported regarding a suspected overdose incident which may or may not relate to this patient. Please check further. If it is inaccurate, please report to [email or web address]"              Cheryl [name], LGSW  07/10/2021, 09:23

## 2021-07-10 NOTE — Telephone Encounter (Signed)
Patient has been placed on the therapy wait list. Patient was made aware of the wait list process by Case Manager.  [name], CASE MANAGER  07/10/2021, 12:25
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
## 2021-07-10 NOTE — Progress Notes (Signed)
[place]   [facility]      Addiction Services Intake Assessment        Progress Note            TELEMEDICINE DOCUMENTATION:    Patient Location:  Zoom visit from [address] [place], [place]  Patient/family aware of provider location:  yes  Patient/family consent for telemedicine:  yes  Examination observed and performed by:  [name], MD              Patient Name: Cheryl [name]   Date of Birth:  September 08, 1985   MRN: [id number]      Date of Service: 07/10/21      Start Time:  10:40     Stop Time:  10:53        Subjective:   Andera is here to establish care in COAT clinic.  Has been on Suboxone 8mg -2mg  BID for a few months.  Was released from jail in 03/2021, then completed 1 month inpatient treatment at [facility] and another 2 months of treatment at [facility], after which she lived at [facility].  Referred to our team by PRSS at [facility].  Reports diagnosis of Bipolar 1 disorder as well.  In past was treated with up to 140 mg of methadone daily, and has used as much as 7 g of fentanyl daily.  Reports some nausea and "withdrawal" symptoms in the mornings on her current dose of Suboxone.  Reports her mood currently is "very good".  Asked about being on a higher dose of Suboxone, we discussed limitations of what insurance would pay for, also discussed option of Sublocade which she was interested in.  At the end of this discussion she decided she will research Sublocade herself and let [facility] know.  Offered to prescribe clonidine for morning symptoms which she was agreeable to.        CURRENT MAT:  NONE                 Cravings-mild                 Side Effects-none                Withdrawal Symptoms-moderate in am          CURRENT MEDICATIONS:    Current Outpatient Medications   Medication Sig   . buprenorphine-naloxone (SUBOXONE) 8-2 mg Sublingual Film Place 1 Film under the tongue Twice daily for 7 days Indications: prevention of relapse to opioid dependence    . cloNIDine HCL (CATAPRES) 0.1 mg Oral Tablet Take 1 Tablet (0.1 mg total) by mouth Three times a day as needed (opioid withdrawal) Indications: symptoms from stopping treatment with opioid drugs        OBJECTIVE: There were no vitals taken for this visit.        MSE:     Orientation: Fully oriented to person, place, time and situation.  [facility]    Appearance:  casually dressed.    Eye Contact:  good.    Behavior:  cooperative.    Attention:  good.    Speech:  normal rate and volume.    Motor:  no psychomotor retardation or agitation.    Mood:  euthymic.    Affect:  congruent to mood.    Thought Process:  linear.    Thought Content:  no paranoia or delusions.   Suicidal Ideation:  none.    Homicidal Ideation:  none.  Perception:  no hallucinations endorsed  Cognition:  some abstract ability.    Insight:  fair.    Judgement:  fair.          Labs:  Urine drug screen today positive for buprenorphine only, negative for all other substances tested.  Send out test results pending.                    ASSESSMENT:       Severe Opioid Use Disorder                                 PLAN:     1.  He will establish within the COAT Orientation Group on 07/17/21           2.  Reviewed the risk/benefits and rationale for buprenorphine and buprenorphine/naloxone for the treatment of opioid use disorder.  He has been reminded to stay off benzodiazepines/ETOH and medications which may contribute to sedation or respiratory depression.    Prescribed Suboxone 8mg -2mg  films, take 1 SL BID, # 14 and clonidine 0.1 mg TID PRN opioid withdrawal symptoms #30.          3.  Bowel regimen to avoid constipation is recommended.      4.  Referrals for psychiatric evaluation are recommended for concerns related to co-morbid psychiatric conditions.     5.  Encouraged follow-up with primary care physician for health care maintenance.       6.  Encouraged recovery work, , groups and counseling.       7.  He is aware of the nature of random  urine drugs screens, pill/strip counts and [facility] CSMP audits.    Total time of visit:  22 minutes    , MD  07/10/2021, 12:00  Assistant Professor, Addiction Medicine  Department of Behavioral Medicine & Psychiatry  [address]  [place], [address] [place]  Phone: [phone] Fax: [phone]

## 2021-07-10 NOTE — Progress Notes (Signed)
pl  BIJAL SIGLIN    [id number]    Jul 19, 1985    Electronic Cigarettes: Vapes 6000 puff cartridges 1-2 weeks since 36 years old    Date NRT Received : 07/10/21     NRT received?  Patches  and Gum     Lot #:  Patches: [id number]    and Gum: [id number]      Expiration Date: Patches: 12/24    and Gum: 11/23      This writer provided patient with Fentanyl Testing Kit comprised of 10 fentanyl testing strips and written directions for using testing strips.    Writer verbally provided patient with directions on using testing strips and signed tracking form when obtaining testing kit.     Sherry Ruffing, LGSW  07/10/2021, 12:16

## 2021-07-10 NOTE — Nursing Note (Signed)
07/10/21 0900   Drug Screen Results   Amphetamine (AMP) Negative   Barbiturates (BAR) Negative   BUP - Cut off Levels 10 ng/ml Positive   Benzodiazepine (BZO) Negative   Cocaine (COC) Negative   Methamphetamine (MET) Negative   Methadone (MTD) Negative   Morphine (MOP) Negative   Oxycodone (OXY) Negative   Marijuana (THC) Negative   Ecstasy (MDMA) Negative   Phencyclidine (PCP) Negative   Temperature within range? yes   Observed no   Tester Landry Lookingbill   Physician Doy Mince   Lot # [id number]   Expiration Date 06/20/2022   Internal Control Valid yes   Initials CND

## 2021-07-11 LAB — FENTANYL CONFIRMATORY/DEFINITIVE, URINE, BY LC-MS/MS (PERFORMABLE)
FENTANYL INTERPRETATION: NEGATIVE
FENTANYL: NOT DETECTED ng/mL (ref <0.5)
NORFENTANYL: NOT DETECTED ng/mL (ref <2)

## 2021-07-12 LAB — ETHYL GLUCURONIDE AND ETHYL SULFATE, URINE
ETHYL GLUCURONIDE: NEGATIVE
ETHYL SULFATE: NEGATIVE

## 2021-07-12 LAB — SUBOXONE CONFIRMATORY/DEFINITIVE, URINE, BY LC-MS/MS (PERFORMABLE)
BUPRENORPHINE: 483 ng/mL — ABNORMAL HIGH (ref <10)
NALOXONE: >1000 ng/mL — ABNORMAL HIGH (ref <5)
NORBUPRENORPHINE: >1000 ng/mL — ABNORMAL HIGH (ref <10)

## 2021-07-12 LAB — ETHYL GLUCURONIDE & ETHYL SULFATE (ALCOHOL METABOLITES),URINE
CREATININE RANDOM URINE: 167 mg/dL — ABNORMAL HIGH (ref 50–100)
ETHYL GLUCURONIDE: NEGATIVE
ETHYL SULFATE: NEGATIVE

## 2021-07-13 ENCOUNTER — Ambulatory Visit (HOSPITAL_COMMUNITY): Payer: Self-pay

## 2021-07-13 NOTE — Telephone Encounter (Signed)
Psychiatry Intake: Case manager called patient to schedule an Intake Appointment  but Phone number was disconnected.     Schedule NPV w/ next PGY-1, 2 or 3 resident, but not the same day as COAT.    Corley. Judeth Cornfield, CASE MANAGER  07/13/2021, 12:56

## 2021-07-14 LAB — NOTES AND COMMENTS

## 2021-07-14 LAB — DRUG MONITORING, GABAPENTIN, QUANTITATIVE, URINE: GABAPENTIN: NEGATIVE ng/mL (ref <1000)

## 2021-07-17 ENCOUNTER — Other Ambulatory Visit: Payer: Self-pay

## 2021-07-17 ENCOUNTER — Telehealth: Payer: MEDICAID | Admitting: Family

## 2021-07-17 ENCOUNTER — Telehealth: Payer: MEDICAID

## 2021-07-17 DIAGNOSIS — F1120 Opioid dependence, uncomplicated: Secondary | ICD-10-CM

## 2021-07-17 DIAGNOSIS — Z79899 Other long term (current) drug therapy: Secondary | ICD-10-CM

## 2021-07-17 MED ORDER — BUPRENORPHINE 8 MG-NALOXONE 2 MG SUBLINGUAL FILM
1.0000 | ORAL_FILM | Freq: Two times a day (BID) | SUBLINGUAL | 0 refills | Status: DC
Start: 2021-07-17 — End: 2021-07-27

## 2021-07-17 NOTE — Progress Notes (Signed)
COAT Orientation Group Progress Note  Patient Name:  Cheryl [name]  MRN: [id number]  Date: 07/17/2021  CC: Follow-up med check.  Due to COVID-19 concerns patient presents for virtual follow-up to the COAT Clinic. The group modality was facilitated via Zoom.     TELEMEDICINE DOCUMENTATION:    Patient Location:  Zoom group med mgt session from [address]  [place] [address]   Patient/family aware of provider location: Yes  Patient/family consent for telemedicine:  Yes  Examination observed and performed by:  Kristen Loader, APRN    The patient was seen as part of a collaborative telemedicine service with their COAT treatment team (case mgr, therapist, prescriber) who participated in the encounter by active presence via approved video/audio means for portions of the encounter.  This clinic visit included medication management, addiction education, and evaluation of progress in recovery.     Patient's progress, psychosocial functioning and treatment response was discussed during the treatment team meeting with the therapist and case manager to assist in medical decision-making.    Today's visit involved extensive interaction with medical assistant, case manager and therapist and included extensive discussion regarding COAT programmatic structure, requirements, policies and procedures.This visit also included extensive discussion regarding proper etiquette for use of tele in a group based format as well as assessment of hardware/software utilization and connection status.    This visit included ongoing assessment of clinical status including ongoing screening/assessment/treatment and coordination of care related to comorbidities associated with OUD. This is a focused med group. Full problem list not reviewed in this setting. Only those problems relevant to group or voiced by pt were discussed.    Subjective:  She has been sober for 8 clinic days and 11 months personal sobriety. Patient attended 5 meetings and  other activities associated with maintaining sobriety since last visit. Patient says she is doing well. She is working full time at [employer] in [employer] [place]. She is working toward independent living.   Patient reports tolerance of Suboxone as:      Cravings-None      Side Effects-None      Withdrawal Symptoms-None       ROS: - constipation, - depression/anxiety, - myalgia, - diaphoresis, - insomnia, all others negative    Medications:  Current Outpatient Medications   Medication Sig   . buprenorphine-naloxone (SUBOXONE) 8-2 mg Sublingual Film Place 1 Film under the tongue Twice daily Indications: prevention of relapse to opioid dependence   . cloNIDine HCL (CATAPRES) 0.1 mg Oral Tablet Take 1 Tablet (0.1 mg total) by mouth Three times a day as needed (opioid withdrawal) Indications: symptoms from stopping treatment with opioid drugs      Objective:  VS deferred    Via video, patient:  Appears stated age; no acute distress; well kempt and casually dressed.  Alert and oriented to person, place, time, and situation.  Expression is relaxed, maintains eye contact.  Speech is clear and coherent, with normal rate, latency, and volume.  Mood is fair to good; affect is congruent with mood.  Thought process is linear.  No indication of responding to internal stimuli.    Assessment: Opioid Use Disorder F11.20      Plan:   Tele capabilities: good    Comprehension of program: good    1. Reviewed the risk/benefits and rationale for buprenorphine and buprenorphine/naloxone for the treatment of opioid use disorder.  2. Tapering and dose reduction are routinely discussed when appropriate.  3. Referrals to  psychiatric evaluation are recommended for complaints of concerning for comorbid psychiatric conditions.  4. Establishing with a PCP is routinely recommended for health care maintenance and for w/u regarding risk factors related to addiction.  5. Continue recovery work, including required [employer].  6. Continue random urine drugs screens and random pill/strip counts and [facility] CSMP audits.  7. Advance to weekly group next Thursday (4/ 20) at 330 PM.  8. Conintue current medication.    Kristen Loader, APRN 07/17/2021, 16:28

## 2021-07-17 NOTE — Progress Notes (Signed)
[place]  Department of Behavioral Medicine Outpatient  Comprehensive Opioid Addiction Treatment (COAT) Program  Orientation Group Therapy Note    TELEMEDICINE DOCUMENTATION:    Patient Location: Zoom Visit from Home address: [address]  [place] [address]    Patient/family aware of provider location:  Yes  Patient/family consent for telemedicine:  Yes  Examination observed and performed by:  Trixie Rude, MBA, MSW, LGSW        Video visit due to the current pandemic, via zoom      Peer Recovery Support Specialist present during group: no    Patient Name:  Cheryl [name]  MRN: [id number]  Date: 07/17/2021   CPT Code: 56256  Time: 12:00pm- 1:00pm  Duration: 60 Minutes   Number in Group: 7    Chief Complaint: "Group Therapy for Substance Use Disorder"      Subjective:   Cheryl [name] presents today for group therapy through the Comprehensive Opioid Addiction Treatment (COAT) program.  She reports 8 clinic days and is engaged in a personal recovery program. The topics discussed in this group included Tele-etiquette, COAT policies, including but not limited to, attendance, random uds/medication counts, prohibited medications, urine drug screen/oral tox kit information, the importance of open communication and honesty about recovery process, and available resources for rides, contacting the clinic, and meetings to help build support network.  Cheryl [name] did indicate the following are potential barriers to treatment, Work schedule . Cheryl [name] also stated that she commits to first 30 days of treatment.     Cheryl [name] will return Thursday  07/26/21 @ 3:30 p.m. for Regular Weekly COAT Group      Objective:   Mood: within normal limits  Affect: congruent to mood   Appearance/Behavior: Alert, Good eye contact, Good participation, Receptive, Supportive of peers, Oriented , Dressed appropriately  and No acute distress   Participation:  active and attentive      Assessment:   Problem List Items Addressed This Visit    None  Visit  Diagnoses     Opioid use disorder, severe, on maintenance therapy (CMS [place])    -  Primary            Procedure:   Patient attended this Orientation COAT group. The focus of this group is to review the policies and procedures of the COAT program. Additionally, education was provided about the disease of addiction and how medication assisted treatment (MAT) works in to aid in the development of recovery, and to support the practice of new recovery tools. Encouragement of attendance at peer support recovery meetings, identification of triggers for use/relapse, and development of a supportive social network we also covered.  The following therapeutic techniques were utilized:identifying and using relapse-prevention strategies to maintain sobriety      Plan:  Maintain abstinence, follow recommendations of Treatment Team, take suboxone as prescribed, and return to clinic for next scheduled appointment. Attend the encouraged twelve step meetings.          Sunday Shams, MBA, MSW, LGSW  Associate Clinical Therapist   Department of Behavioral Medicine & Psychiatry   07/17/2021, 13:15      I agree with the plan of care as documented above.      Gavin Potters, Loreli Slot, PhD  Assistant Professor  Department of Behavioral Medicine & Psychiatry  07/17/2021, 13:15

## 2021-07-26 ENCOUNTER — Encounter (HOSPITAL_COMMUNITY): Payer: Self-pay

## 2021-07-26 ENCOUNTER — Other Ambulatory Visit: Payer: Self-pay

## 2021-07-26 ENCOUNTER — Encounter (HOSPITAL_COMMUNITY): Payer: MEDICAID | Admitting: Social Worker

## 2021-07-26 ENCOUNTER — Telehealth: Payer: MEDICAID | Admitting: Family

## 2021-07-26 DIAGNOSIS — F1120 Opioid dependence, uncomplicated: Secondary | ICD-10-CM

## 2021-07-26 NOTE — Progress Notes (Signed)
Patient did not appear for weekly COAT visit; encounter opened in error.

## 2021-07-27 ENCOUNTER — Encounter (HOSPITAL_COMMUNITY): Payer: Self-pay

## 2021-07-27 ENCOUNTER — Telehealth: Payer: MEDICAID | Admitting: Family

## 2021-07-27 DIAGNOSIS — F1120 Opioid dependence, uncomplicated: Secondary | ICD-10-CM

## 2021-07-27 DIAGNOSIS — Z79899 Other long term (current) drug therapy: Secondary | ICD-10-CM

## 2021-07-27 MED ORDER — BUPRENORPHINE 8 MG-NALOXONE 2 MG SUBLINGUAL FILM
1.0000 | ORAL_FILM | Freq: Two times a day (BID) | SUBLINGUAL | 0 refills | Status: DC
Start: 2021-07-27 — End: 2021-07-31

## 2021-07-27 NOTE — Progress Notes (Signed)
[facility]  Department of Behavioral Medicine Outpatient  Comprehensive Opioid Addiction Treatment (COAT) Program  Make-up Group Progress Note    TELEMEDICINE DOCUMENTATION:    Patient Location:  Zoom Visit from  [address] [place] [address]  Patient/family aware of provider location:  Yes  Patient/family consent for telemedicine:  Yes  Examination observed and performed by: Delila Pereyra Totiana Everson, FNP-BC    Video Visit, via Zoom    Peer Recovery Support Specialist present during group: no    Patient Name:  Cheryl [name]  MRN: [id number]  Date: 07/27/2021   Chief Complaint: "Follow up Med Check for Substance Use Disorder"  Current MOUD: Continue previously prescribed 8/2 mg Buprenorphine-naloxone (Suboxone) Films , Directions to take 2 Films SL Daily.     Subjective:    Patient reports 18 clinic days at this time. Attended 5 meetings this week.   Seen in make up. Lost track of days and thought today was Thursday. Living at [facility] and likes it there (from [facility]). No cravings/thoughts of use.     Objective:  Mood: within normal limits    Affect: congruent to mood     Speech: normal rate/ clear/ coherent    Thought process: Goal directed/Linear     Attention: maintained and interactive     Appearance/Behavior: Alert, Good eye contact, Good participation, Receptive, Supportive of peers, Oriented , Dressed appropriately  and No acute distress      Cravings: Denies  Withdrawal Symptoms: None  Side Effects: none    Motivation: Good    Comprehension of Program: Good      Urine drug screen:  Drug Screen Results 07/10/2021   Amphetamine (AMP) Negative   Barbiturates (BAR) Negative   BUP - Cut off Levels 10 ng/ml Positive   Benzodiazepine (BZO) Negative   Cocaine (COC) Negative   Methamphetamine (MET) Negative   Methadone (MTD) Negative   Oxycodone (OXY) Negative   Marijuana (THC) Negative   Ecstasy (MDMA) Negative   Phencyclidine (PCP) Negative   Temperature within range? yes   Observed no   Tester Ciara    Physician Doy Mince   Lot # [id number]   Expiration Date 06/20/2022   Internal Control Valid yes   Initials CND       Assessment:    Problem List Items Addressed This Visit    None  Visit Diagnoses     Opioid use disorder, severe, on maintenance therapy (CMS [facility])    -  Primary    Relevant Medications    buprenorphine-naloxone (SUBOXONE) 8-2 mg Sublingual Film        Plan:  1. Reviewed the risk/benefits and rationale for buprenorphine and buprenorphine/naloxone for the treatment of opioid use disorder.  2. Tapering and dose reduction are routinely discussed when appropriate.  3. Referrals to psychiatric evaluation are recommended for complaints of concerning for comorbid psychiatric conditions.  4. Establishing with a PCP is routinely recommended for health care maintenance and for work up regarding risk factors related to addiction.  5. Continue recovery work, including peer recovery meetings.  6. Continue random urine drugs screens and random pill/strip counts and [place] audits.  7. Return in 6 days for Weekly Group  8. Continue medication as prescribed.     Delila Pereyra Jerusalem Brownstein, FNP-BC  07/27/2021, 15:52

## 2021-08-02 ENCOUNTER — Telehealth: Payer: MEDICAID | Admitting: Family

## 2021-08-02 ENCOUNTER — Ambulatory Visit (HOSPITAL_COMMUNITY): Payer: MEDICAID | Admitting: Social Worker

## 2021-08-02 ENCOUNTER — Other Ambulatory Visit: Payer: Self-pay

## 2021-08-02 DIAGNOSIS — Z79899 Other long term (current) drug therapy: Secondary | ICD-10-CM

## 2021-08-02 DIAGNOSIS — F1120 Opioid dependence, uncomplicated: Secondary | ICD-10-CM

## 2021-08-02 MED ORDER — BUPRENORPHINE 8 MG-NALOXONE 2 MG SUBLINGUAL FILM
1.0000 | ORAL_FILM | Freq: Two times a day (BID) | SUBLINGUAL | 0 refills | Status: DC
Start: 2021-08-02 — End: 2021-08-09

## 2021-08-02 NOTE — Progress Notes (Signed)
[place]  Department of Behavioral Medicine Outpatient  Comprehensive Opioid Addiction Treatment (COAT) Program  Weekly Group Progress Note    TELEMEDICINE DOCUMENTATION:    Patient Location:  Zoom Visit from  [address] [place] [address]  Patient/family aware of provider location:  Yes  Patient/family consent for telemedicine:  Yes  Examination observed and performed by: Delila Pereyra Murriel Eidem, FNP-BC    Video Visit, via Zoom    Peer Recovery Support Specialist present during group: no    Patient Name:  Cheryl [name]  MRN: [id number]  Date: 08/02/2021   Chief Complaint: "Follow up Med Check for Substance Use Disorder"  Current MOUD: Continue previously prescribed 8/2 mg Buprenorphine-naloxone (Suboxone) Films , Directions to take 2 Films SL Daily.     Subjective:    Patient reports 24 clinic days at this time. Attended 6 meetings this week. Doing "great". No concerns. Positive outlook; hopeful.     Objective:  Mood: within normal limits    Affect: congruent to mood     Speech: normal rate/ clear/ coherent    Thought process: Goal directed/Linear     Attention: maintained and interactive     Appearance/Behavior: Alert, Good eye contact, Good participation, Receptive, Supportive of peers, Oriented , Dressed appropriately  and No acute distress      Cravings: Denies  Withdrawal Symptoms: None  Side Effects: none    Motivation: Good    Comprehension of Program: Good      Urine drug screen:  Drug Screen Results 07/10/2021   Amphetamine (AMP) Negative   Barbiturates (BAR) Negative   BUP - Cut off Levels 10 ng/ml Positive   Benzodiazepine (BZO) Negative   Cocaine (COC) Negative   Methamphetamine (MET) Negative   Methadone (MTD) Negative   Oxycodone (OXY) Negative   Marijuana (THC) Negative   Ecstasy (MDMA) Negative   Phencyclidine (PCP) Negative   Temperature within range? yes   Observed no   Tester Ciara   Physician Doy Mince   Lot # [id number]   Expiration Date 06/20/2022   Internal Control Valid yes   Initials CND        Assessment:    Problem List Items Addressed This Visit    None  Visit Diagnoses     Opioid use disorder, severe, on maintenance therapy (CMS [facility])        Relevant Medications    buprenorphine-naloxone (SUBOXONE) 8-2 mg Sublingual Film        Plan:  1. Reviewed the risk/benefits and rationale for buprenorphine and buprenorphine/naloxone for the treatment of opioid use disorder.  2. Tapering and dose reduction are routinely discussed when appropriate.  3. Referrals to psychiatric evaluation are recommended for complaints of concerning for comorbid psychiatric conditions.  4. Establishing with a PCP is routinely recommended for health care maintenance and for work up regarding risk factors related to addiction.  5. Continue recovery work, including peer recovery meetings.  6. Continue random urine drugs screens and random pill/strip counts and [place] audits.  7. Return in 7 days for Weekly Group  8. Continue medication as prescribed.     Delila Pereyra Algenis Ballin, FNP-BC  08/02/2021, 20:00

## 2021-08-09 ENCOUNTER — Telehealth: Payer: MEDICAID | Admitting: Family

## 2021-08-09 ENCOUNTER — Telehealth: Payer: MEDICAID | Admitting: Social Worker

## 2021-08-09 ENCOUNTER — Other Ambulatory Visit: Payer: Self-pay

## 2021-08-09 DIAGNOSIS — F1120 Opioid dependence, uncomplicated: Secondary | ICD-10-CM

## 2021-08-09 DIAGNOSIS — Z79899 Other long term (current) drug therapy: Secondary | ICD-10-CM

## 2021-08-09 MED ORDER — BUPRENORPHINE 8 MG-NALOXONE 2 MG SUBLINGUAL FILM
1.0000 | ORAL_FILM | Freq: Two times a day (BID) | SUBLINGUAL | 0 refills | Status: DC
Start: 2021-08-09 — End: 2021-08-16

## 2021-08-09 NOTE — Progress Notes (Signed)
COAT Clinic Progress Note    Patient Name:  Cheryl [name]   MRN: [id number]    Date of Service: 08/09/2021    TELEMEDICINE DOCUMENTATION:    Patient Location:  MyChart video visit from home address: [address]  [place] [address]    Patient/family aware of provider location:  yes  Patient/family consent for telemedicine:  yes  Examination observed and performed by:  Kristen Loader, APRN        Due to COVID-19 concerns, Cheryl [name] presents for virtual follow-up to the weekly Tele-COAT Clinic by the Department of Behavioral Medicine and Psychiatry at [facility] in [place], [place]. The group modality was facilitated via Zoom. This clinic visit included medication management, addiction education, and evaluation of progress in recovery in a group setting. The patient was seen as part of a collaborative telemedicine service with their COAT treatment team (case mgr, therapist, prescriber) who participated in the encounter by active presence via approved video/audio means for portions of the encounter.      Subjective:     She has been sober for 31 days. Patient attended daily meetings and other activities associated with maintaining sobriety since last visit. Patient says she is doing well in her sober living home. She is still working at [employer].   Patient reports tolerance of Suboxone as:      Cravings-None      Side Effects-None      Withdrawal Symptoms-None        Objective:  VS deferred    Via video, patient:  Appears stated age; no acute distress; well kempt and casually dressed.  Alert and oriented to person, place, time, and situation.  Expression is relaxed, maintains eye contact.  Speech is clear and coherent, with normal rate, latency, and volume.  Mood is fair to good; affect is congruent with mood.  Thought process is linear.  No indication of responding to internal stimuli.      Assessment:  Opioid use disorder,  on maintenance therapy                                  Plan:     1. Integrated discussion in group regarding the following: a) recovery maintenance, including recognition of potential triggers and strategies for avoidance of/coping with triggers; b) continued recovery work, including attendance at required 12-step meetings; and, c) general health maintenance, including adequate rest and exercise.  2. Recommend attendance at 4 recovery meetings, even if online.  3. Will plan on continuing current Suboxone regimen, 16 mg daily.      Coordination of Care:  Patient's progress, psychosocial functioning and treatment response was discussed during the treatment team meeting with the therapist and case manager to assist in medical decision-making.  Visit length: 30 minutes       Kristen Loader, APRN  08/09/2021, 16:35

## 2021-08-14 NOTE — Progress Notes (Signed)
[facility]  Department of Behavioral Medicine and Psychiatry  [facility]  Comprehensive Opioid Addiction Treatment (COAT) Program    COAT GROUP THERAPY PROGRESS NOTE  NAME: Cheryl [name]  MRN: [id number]  Date of Service: 08/09/2021  TIME: 4:35pm - 5:25pm (50 minutes)  GROUP MEMBERS PRESENT: 7 Members   CPT CODE: 93570  CHIEF COMPLAINT: Group Therapy    TELEMEDICINE DOCUMENTATION:    Patient Location:  MyChart video visit from home address: [address]  [place] [address]    Patient/family aware of provider location:  yes  Patient/family consent for telemedicine:  yes  Examination observed and performed by:  Corena Herter, LGSW    Subjective: Cheryl [name] was seen for group therapy, as part of the Comprehensive Opioid Addiction Treatment (COAT) Clinic. She reports having 31 days sober, and reported attending any peer-led support meetings with psychiatrist. Group was held over Zoom. Patients were educated on how to help protect their privacy as well as the privacy of other members within the group. Discussion was facilitated in an open format, as well as by individual check-in.     Topics discussed in group included providing group members space to process their week and gain support needed, naming one goal they had for the week to support their overall well-being, and talked about what they each do to support themselves on mental health days when things feel really hard and impossible. Her goal is to go see family. She reports being grateful being sober and getting to see her family.      Observation:  Mood: euthymic  Affect: Congruent to mood  Thought processes: Cogent and eye contact is good  Appearance: Casually dressed   Behavior: Active participant     Assessment: (Dx) Opioid use disorder, on maintenance therapy    Procedure: Patient attended the weekly process and psychoeducational COAT Group, designed to be appropriate for individuals with opioid dependence in early  recovery. The focus of this group to educate about the process of  building a sound recovery program, to facilitate the understanding of the disease of addiction and how Medication Assisted Treatment (MAT) works to aid in the development of recovery, and to support the practice of new recovery tools. Encouragement of attendance at 12 step recovery meetings, identification of triggers for use/relapse and the development of a supportive social network were also covered. The following therapeutic techniques were utilized: CBT, mindfulness, motivational interviewing, feeling/emotion validation.    Plan: Abstinence, take medication as prescribed, and return to clinic for next scheduled appointment.    [address] Randa Evens, MSW, LGSW  Associate Clinical Therapist  [facility] Department of Behavioral Medicine and Psychiatry  08/14/2021 13:29    I have reviewed the note and agree with procedure and plan as documented.     Carollee Herter, PhD, LICSW  Assistant Professor, Clinical Therapist  Department of Behavioral Medicine and Psychiatry

## 2021-08-16 ENCOUNTER — Ambulatory Visit (HOSPITAL_COMMUNITY): Payer: Self-pay | Admitting: Psychiatry

## 2021-08-16 ENCOUNTER — Telehealth: Payer: MEDICAID | Admitting: Social Worker

## 2021-08-16 ENCOUNTER — Other Ambulatory Visit: Payer: Self-pay

## 2021-08-16 ENCOUNTER — Telehealth: Payer: MEDICAID | Admitting: PSYCHIATRY

## 2021-08-16 DIAGNOSIS — F1120 Opioid dependence, uncomplicated: Secondary | ICD-10-CM

## 2021-08-16 DIAGNOSIS — Z79899 Other long term (current) drug therapy: Secondary | ICD-10-CM

## 2021-08-16 MED ORDER — BUPRENORPHINE 8 MG-NALOXONE 2 MG SUBLINGUAL FILM
1.0000 | ORAL_FILM | Freq: Two times a day (BID) | SUBLINGUAL | 0 refills | Status: DC
Start: 2021-08-16 — End: 2021-08-23

## 2021-08-16 NOTE — Progress Notes (Signed)
COAT Clinic Progress Note  Patient Name:  Cheryl [name]  MRN: [id number]  Date: 08/16/2021  CC: Follow-up med check.  SUBJECTIVE:  Due to COVID-19 concerns patient presents for virtual follow-up to the COAT Clinic. The group modality was facilitated via Zoom.     The patient was seen as part of a collaborative telemedicine service with their COAT treatment team (case mgr, therapist, prescriber) who participated in the encounter by active presence via approved video/audio means for portions of the encounter.  This clinic visit included medication management, addiction education, and evaluation of progress in recovery.     This visit included extensive discussion regarding proper etiquette for use of tele in a group based format.    TELEMEDICINE DOCUMENTATION:    Patient Location:  Zoom group med mgt session from [address]  [place] [address]   Patient/family aware of provider location: Yes  Patient/family consent for telemedicine:  Yes  Examination observed and performed by:  Lauretta Chester, MD    Patient's progress, psychosocial functioning and treatment response was discussed during the treatment team meeting with the therapist and case manager to assist in medical decision-making.    Risks and benefits of MAT/bup/nlx for opioid use disorder are routinely discussed.     Patient is tolerating medication well without side-effects.     Denies relapse. Discussed recovery supports.    ROS: - constipation, - depression/anxiety, - myalgia, - diaphoresis, - insomnia, all others negative    Medications:  Current Outpatient Medications   Medication Sig   . buprenorphine-naloxone (SUBOXONE) 8-2 mg Sublingual Film Place 1 Film under the tongue Twice daily Indications: prevention of relapse to opioid dependence   . cloNIDine HCL (CATAPRES) 0.1 mg Oral Tablet Take 1 Tablet (0.1 mg total) by mouth Three times a day as needed (opioid withdrawal) Indications: symptoms from stopping treatment with opioid drugs       OBJECTIVE:  MSE: Dressed appropriately, AOX3, Mood euthymic, No psychomotor agitation or retardation. Cooperative. Speech was at a normal rate and volume. Thought process was linear and goal directed. Thought content was appropriate. Judgment and insight are fair-good.    ASSESSMENT:  Opioid Use Disorder F11.20    PLAN:   Continue COAT Clinic and current medication.  Reviewed the risk/benefits and rationale for buprenorphine and buprenorphine/naloxone for the treatment of opioid use disorder.  Tapering and dose reduction are routinely discussed when appropriate.  Tobacco cessation is routinely encouraged. Various treatments are made available to those who are interested.  Bowel regimen to avoid constipation is routinely discussed/recommended.   Referrals to psychiatric evaluation are recommended for complaints of concerning for comorbid psychiatric conditions.  Establishing with a PCP is routinely recommended for health care maintenance and for w/u regarding risk factors related to addiction.  Continue recovery work, including required [employer].  Continue random urine drugs screens and random pill/strip counts and [facility] CSMP audits.  Return as Scheduled.   This is a focused med group. Full problem list not reviewed in this setting.  Only those problems relevant to group or voiced by pt were discussed.    Lauretta Chester, MD 08/16/2021, 17:08

## 2021-08-23 ENCOUNTER — Telehealth: Payer: MEDICAID | Admitting: Social Worker

## 2021-08-23 ENCOUNTER — Telehealth: Payer: MEDICAID | Admitting: Psychiatry

## 2021-08-23 ENCOUNTER — Other Ambulatory Visit: Payer: Self-pay

## 2021-08-23 DIAGNOSIS — F1120 Opioid dependence, uncomplicated: Secondary | ICD-10-CM

## 2021-08-23 DIAGNOSIS — Z79899 Other long term (current) drug therapy: Secondary | ICD-10-CM

## 2021-08-23 MED ORDER — BUPRENORPHINE 8 MG-NALOXONE 2 MG SUBLINGUAL FILM
1.0000 | ORAL_FILM | Freq: Two times a day (BID) | SUBLINGUAL | 0 refills | Status: DC
Start: 2021-08-23 — End: 2021-08-30

## 2021-08-23 NOTE — Progress Notes (Signed)
COAT TELE-Clinic Progress Note  TELEMEDICINE DOCUMENTATION:    Patient Location:  [address]  [place] [address]   Patient/family aware of provider location:  YES  Patient/family consent for telemedicine:  YES    Due to COVID-19 concerns patient presents for virtual follow-up to the COAT Clinic. The group modality was facilitated via Zoom.     The patient was seen as part of a collaborative telemedicine service with their COAT treatment team (case mgr, therapist, prescriber) who participated in the encounter by active presence via approved video/audio means for portions of the encounter.  This clinic visit included medication management, addiction education, and evaluation of progress in recovery.     This visit included extensive discussion regarding proper etiquette for use of tele in a group based format.    Examination observed and performed by:  Nikki Dom. Gwenlyn Found, [facility] Tele-Video-Audio Progress Note  Patient Name:  Cheryl [name]  MRN: [id number]  Date: 08/23/2021   Chief Complaint: "Addiction"  Current MAT: Buprenorphine   16 mg  Subjective:    Patient has been sober for 45 days.   Moved to [place] this  Year following treatment at [facility]. Reviewed history. Living in a sober living home. Goes to 5 meetings a week. Looking for sponsor.   Cravings-None   Withdrawal Symptoms- None  Side Effects-None    Motivation is good    General Exam:  Patient is in no acute distress  alert and oriented      Mental Status:  Speech is clear and coherent.  Thought process is linear.  No indication of responding to internal stimuli.  Affect is stable  Attention is sustained  Concentration is sustained  Insight is good    Assessment:  Opioid Dependence 304.9 - Stable        Plan:  Continue COAT Clinic and medication.  [id number]  Coordination of Care:  Coordination of Care:  Patient was seen in the [facility] in the Department of Behavioral Medicine and Psychiatry at [facility] in [place], [place]. This  clinic visit included medication management, addiction education, and evaluation of progress in recovery in a group setting. Patient's progress, psychosocial functioning and treatment response was discussed during the treatment team meeting with the case manager and/or therapist to assist in medical decision-making.    Luetta Nutting, DO 08/23/2021

## 2021-08-23 NOTE — Nursing Note (Signed)
Person notified to present to [facility] by 4:30pm or to [facility] by 3:30 pm on 08/27/21 for OBSERVED (unless absolutely not possible)  urine drug screen.  Pt to be sent out for ETGR, GABA , BUP, and FENT     Pt reported 45 with No Relapse      Gita Kudo, MA

## 2021-08-23 NOTE — Progress Notes (Signed)
[facility] Medicine  Department of Behavioral Medicine and Psychiatry  [facility]  Comprehensive Opioid Addiction Treatment (COAT) Program    COAT GROUP THERAPY PROGRESS NOTE  NAME: Cheryl [name]  MRN: [id number]  Date of Service: 08/16/2021  TIME: 4:30pm - 5:30pm (60 minutes)  GROUP MEMBERS PRESENT: 7 Members   CPT CODE: 93267  CHIEF COMPLAINT: Group Therapy    TELEMEDICINE DOCUMENTATION:    Patient Location:  MyChart video visit from home address: [address]  [place] [address]    Patient/family aware of provider location:  yes  Patient/family consent for telemedicine:  yes  Examination observed and performed by:  Corena Herter, LGSW    Subjective: Cheryl [name] was seen for group therapy, as part of the Comprehensive Opioid Addiction Treatment (COAT) Clinic. She reports having 38 days sober, and reported attending any peer-led support meetings with psychiatrist. Group was held over Zoom. Patients were educated on how to help protect their privacy as well as the privacy of other members within the group. Discussion was facilitated in an open format, as well as by individual check-in.     During this group, a speaker from an advanced COAT group with over six years of sobriety shared her experience with addiction and recovery, and offered the group members encouragement and motivation. They were encouraged to ask questions and participate as they felt comfortable.       Observation:  Mood: euthymic  Affect: Congruent to mood  Thought processes: Cogent and eye contact is good  Appearance: Casually dressed   Behavior: Active participant     Assessment: (Dx) Opioid use disorder, on maintenance therapy    Procedure: Patient attended the weekly process and psychoeducational COAT Group, designed to be appropriate for individuals with opioid dependence in early recovery. The focus of this group to educate about the process of  building a sound recovery program, to facilitate the understanding of  the disease of addiction and how Medication Assisted Treatment (MAT) works to aid in the development of recovery, and to support the practice of new recovery tools. Encouragement of attendance at 12 step recovery meetings, identification of triggers for use/relapse and the development of a supportive social network were also covered. The following therapeutic techniques were utilized: CBT, mindfulness, motivational interviewing, feeling/emotion validation.    Plan: Abstinence, take medication as prescribed, and return to clinic for next scheduled appointment.    [address] Randa Evens, MSW, LGSW  Associate Clinical Therapist  [place] Department of Behavioral Medicine and Psychiatry  08/23/2021 17:01    I have reviewed the note and agree with procedure and plan as documented.     Carollee Herter, PhD, LICSW  Assistant Professor, Clinical Therapist  Department of Behavioral Medicine and Psychiatry

## 2021-08-23 NOTE — Progress Notes (Signed)
[facility] Medicine  Department of Behavioral Medicine and Psychiatry  [facility]  Comprehensive Opioid Addiction Treatment (COAT) Program    COAT GROUP THERAPY PROGRESS NOTE  NAME: Cheryl [name]  MRN: [id number]  Date of Service: 08/23/2021  TIME: 4:15pm - 4:45pm (30 minutes)  GROUP MEMBERS PRESENT: 5 Members   CPT CODE: [id number]  CHIEF COMPLAINT: Group Therapy    TELEMEDICINE DOCUMENTATION:    Patient Location:  MyChart video visit from home address: [address]  [place] [address]    Patient/family aware of provider location:  yes  Patient/family consent for telemedicine:  yes  Examination observed and performed by:  Corena Herter, LGSW    Subjective: Cheryl [name] was seen for group therapy, as part of the Comprehensive Opioid Addiction Treatment (COAT) Clinic. She reports having 45 days sober, and reported attending any peer-led support meetings with psychiatrist. Group was held over Zoom. Patients were educated on how to help protect their privacy as well as the privacy of other members within the group. Discussion was facilitated in an open format, as well as by individual check-in.     Topics discussed in group included providing group members space to process their week and gain support needed, naming one goal they had for the week to support their overall well-being, and discussed their experience with the speaker last week and what they gained from hearing her speak of her story. We talked through a brief drawing exercise to help with anxiety and intrusive thoughts. We also briefly discussed recovery capital and they were informed next group will include discussion of what it is and what goes into their version of recovery capital. Each member was encouraged to utilize a shorter time in group to go outside and enjoy the beautiful weather, and shared they will be going outside for a walk and wearing shorts and a tank in hopes of getting a tan. Her goal is to walk more this week for  wellness. She reports being grateful to be able to go outside for a walk, to have the capability both mentally and physically.      Observation:  Mood: euthymic  Affect: Congruent to mood  Thought processes: Cogent and eye contact is good  Appearance: Casually dressed   Behavior: Active participant     Assessment: (Dx) Opioid use disorder, on maintenance therapy    Procedure: Patient attended the weekly process and psychoeducational COAT Group, designed to be appropriate for individuals with opioid dependence in early recovery. The focus of this group to educate about the process of  building a sound recovery program, to facilitate the understanding of the disease of addiction and how Medication Assisted Treatment (MAT) works to aid in the development of recovery, and to support the practice of new recovery tools. Encouragement of attendance at 12 step recovery meetings, identification of triggers for use/relapse and the development of a supportive social network were also covered. The following therapeutic techniques were utilized: CBT, mindfulness, motivational interviewing, feeling/emotion validation.    Plan: Abstinence, take medication as prescribed, and return to clinic for next scheduled appointment.    [address] Randa Evens, MSW, LGSW  Associate Clinical Therapist  [place] Department of Behavioral Medicine and Psychiatry  08/23/2021 17:13    I have reviewed the note and agree with procedure and plan as documented.     Carollee Herter, PhD, LICSW  Assistant Professor, Clinical Therapist  Department of Behavioral Medicine and Psychiatry

## 2021-08-29 ENCOUNTER — Ambulatory Visit (HOSPITAL_COMMUNITY): Payer: Self-pay

## 2021-08-29 NOTE — Telephone Encounter (Signed)
Person notified by phone only to present to [facility] by 4pm, [facility] by 330pm on 08/31/21  for OBSERVED (NO EXCEPTIONS) urine drug screen.  Pt to be sent out for ETGR, GABA , BUP and FENT       Guillermo Difrancesco, CT  08/29/2021, 14:05

## 2021-08-30 ENCOUNTER — Telehealth: Payer: MEDICAID | Admitting: Psychiatry

## 2021-08-30 ENCOUNTER — Telehealth: Payer: MEDICAID | Admitting: Social Worker

## 2021-08-30 ENCOUNTER — Other Ambulatory Visit: Payer: Self-pay

## 2021-08-30 DIAGNOSIS — Z79899 Other long term (current) drug therapy: Secondary | ICD-10-CM

## 2021-08-30 DIAGNOSIS — F1120 Opioid dependence, uncomplicated: Secondary | ICD-10-CM

## 2021-08-30 MED ORDER — BUPRENORPHINE 8 MG-NALOXONE 2 MG SUBLINGUAL FILM
1.0000 | ORAL_FILM | Freq: Two times a day (BID) | SUBLINGUAL | 0 refills | Status: DC
Start: 2021-08-30 — End: 2021-09-06

## 2021-08-30 NOTE — Progress Notes (Signed)
COAT TELE-Clinic Progress Note  TELEMEDICINE DOCUMENTATION:    Patient Location:  [address]  [place] [address]   Patient/family aware of provider location:  YES  Patient/family consent for telemedicine:  YES    Due to COVID-19 concerns patient presents for virtual follow-up to the COAT Clinic. The group modality was facilitated via Zoom.     The patient was seen as part of a collaborative telemedicine service with their COAT treatment team (case mgr, therapist, prescriber) who participated in the encounter by active presence via approved video/audio means for portions of the encounter.  This clinic visit included medication management, addiction education, and evaluation of progress in recovery.     This visit included extensive discussion regarding proper etiquette for use of tele in a group based format.    Examination observed and performed by:  Allayne Gitelman. Allyson Sabal, DO COAT Clinic Tele-Video-Audio Progress Note  Patient Name:  Cheryl [name]  MRN: [id number]  Date: 08/30/2021   Chief Complaint: "Addiction"  Current MAT: Buprenorphine   16 mg  Subjective:    Patient has been sober for 1 day due to missed screen.   Things have been "great!". Has some job prospects. Went to [employer]. No sponsor yet. Would have had 52 days today but missed a urine drug screen.   Cravings-None   Withdrawal Symptoms- None  Side Effects-None    Motivation is good    General Exam:  Patient is in no acute distress  alert and oriented      Mental Status:  Speech is clear and coherent.  Thought process is linear.  No indication of responding to internal stimuli.  Affect is stable  Attention is sustained  Concentration is sustained  Insight is good    Assessment:  Opioid Dependence 304.9 - Stable        Plan:  Continue COAT Clinic and medication.  [id number]  Coordination of Care:  Coordination of Care:  Patient was seen in the COAT Clinic in the Department of Behavioral Medicine and Psychiatry at [facility] in [place], [place]. This  clinic visit included medication management, addiction education, and evaluation of progress in recovery in a group setting. Patient's progress, psychosocial functioning and treatment response was discussed during the treatment team meeting with the case manager and/or therapist to assist in medical decision-making.    [name], DO 08/30/2021

## 2021-08-31 ENCOUNTER — Ambulatory Visit (INDEPENDENT_AMBULATORY_CARE_PROVIDER_SITE_OTHER): Payer: MEDICAID

## 2021-08-31 ENCOUNTER — Other Ambulatory Visit: Payer: MEDICAID | Attending: Psychiatry | Admitting: Psychiatry

## 2021-08-31 DIAGNOSIS — Z5181 Encounter for therapeutic drug level monitoring: Secondary | ICD-10-CM

## 2021-08-31 DIAGNOSIS — F1120 Opioid dependence, uncomplicated: Secondary | ICD-10-CM

## 2021-08-31 LAB — CREATININE URINE, RANDOM: CREATININE RANDOM URINE: 94 mg/dL (ref 50–100)

## 2021-08-31 NOTE — Nursing Note (Signed)
08/31/21 1400   Drug Screen Results   Amphetamine (AMP) Negative   Barbiturates (BAR) Negative   BUP - Cut off Levels 10 ng/ml Positive   Benzodiazepine (BZO) Negative   Cocaine (COC) Negative   Methamphetamine (MET) Negative   Methadone (MTD) Negative   Morphine (MOP) Negative   Oxycodone (OXY) Negative   Marijuana (THC) Negative   Ecstasy (MDMA) Negative   Phencyclidine (PCP) Negative   Temperature within range? yes   Observed yes   Tester Meaghan Whistler   Physician Gwenlyn Found   Lot # [id number]   Expiration Date 06/20/2022   Internal Control Valid yes   Initials CND     Days Sober Reported PRIOR to being asked for urine drug screen:  1 (days reset, no use)  on 08/31/2021 with No Relapse       If relapse, on what?     Other concerns to be notes:     Patient sent out for ETGR, GABA , BUP, and FENT     Mia Winthrop, MA  08/31/2021, 14:24

## 2021-09-04 LAB — ETHYL GLUCURONIDE AND ETHYL SULFATE, URINE
ETHYL GLUCURONIDE: NEGATIVE
ETHYL SULFATE: NEGATIVE

## 2021-09-04 LAB — ETHYL GLUCURONIDE & ETHYL SULFATE (ALCOHOL METABOLITES),URINE
CREATININE RANDOM URINE: 94 mg/dL (ref 50–100)
ETHYL GLUCURONIDE: NEGATIVE
ETHYL SULFATE: NEGATIVE

## 2021-09-05 ENCOUNTER — Encounter (HOSPITAL_COMMUNITY): Payer: Self-pay

## 2021-09-05 LAB — SUBOXONE CONFIRMATORY/DEFINITIVE, URINE, BY LC-MS/MS (PERFORMABLE)
BUPRENORPHINE: 224 ng/mL — ABNORMAL HIGH (ref <10)
NALOXONE: >1000 ng/mL — ABNORMAL HIGH (ref <5)
NORBUPRENORPHINE: >1000 ng/mL — ABNORMAL HIGH (ref <10)

## 2021-09-05 LAB — FENTANYL CONFIRMATORY/DEFINITIVE, URINE, BY LC-MS/MS (PERFORMABLE)
FENTANYL INTERPRETATION: NEGATIVE
FENTANYL: NOT DETECTED ng/mL (ref <0.5)
NORFENTANYL: NOT DETECTED ng/mL (ref <2)

## 2021-09-05 NOTE — Progress Notes (Signed)
[place] Controlled Substance Full Name Report Report Date 09/05/2021   From 06/05/2021 To 09/04/2021 Date of Birth July 23, 1985 Prescription Count 9   Last Name [place] First Name [place] Name                   Patients included in report that appear to match the search criteria.   Last Name First Name Middle Name Gender Address   [place] [address] , [place], [place], [address]         Prescriber Name Prescriber [place] Name Seg[place] Zip Rx Written Date Rx Dispense Date  & Date Sold   Rx Number Product Name MEDD Status Strength Qty Days # of Refill Sched Payment Type   Gayl Anschutz [address] [place], [place], [place] [id number] [place] [id number] 08/23/2021 08/27/2021 08/27/2021 [id number] Suboxone Film INACTIVE 8 mg/1; 2 mg/1 14 7  0/0 CIII Medicaid   Shirlee Limerick [id number] [place] [id number] 08/16/2021 08/21/2021 08/21/2021 [id number] Suboxone Film INACTIVE 8 mg/1; 2 mg/1 14 7  0/0 CIII Medicaid   Ermalene Postin, Fnp-Bc [id number] Rayetta Pigg [id number] 08/09/2021 08/13/2021 08/13/2021 [id number] Suboxone Film INACTIVE 8 mg/1; 2 mg/1 14 7  0/0 CIII Medicaid   Neysa Hotter (Dnp) [id number] [place] [id number] 08/02/2021 08/06/2021 08/06/2021 [id number] Suboxone Film INACTIVE 8 mg/1; 2 mg/1 14 7  0/0 CIII Medicaid   Neysa Hotter (Dnp) [id number] [place] [id number] 07/27/2021 07/30/2021 07/30/2021 [id number] Suboxone Film INACTIVE 8 mg/1; 2 mg/1 12 6  0/0 CIII Medicaid   Ermalene Postin, Fnp-Bc [id number] Rayetta Pigg [id number] 07/17/2021 07/17/2021 07/17/2021 [id number] Suboxone Film INACTIVE 8 mg/1; 2 mg/1 20 10  0/0 CIII Medicaid   Elvia Collum [id number]  [place]. [id number] 07/10/2021 07/10/2021 07/11/2021 [id number] Suboxone Film INACTIVE 8 mg/1; 2 mg/1 14 7  0/0 CIII Medicaid   Lovena Le., Fnp-Bc [id number] [phone] [employer] [id number] 06/25/2021 06/25/2021 06/25/2021  [id number] Suboxone Film INACTIVE 8 mg/1; 2 mg/1 28 14  0/0 CIII Medicaid   Lovena Le., Fnp-Bc [id number] [phone] [employer] [facility] [id number] 06/11/2021 06/11/2021 06/11/2021 [id number] Suboxone Film INACTIVE 8 mg/1; 2 mg/1 28 14  0/0 CIII Medicaid         Suspected Overdose Older Than 12 Months - As reported by [facility] service providers (EMS, ER, etc.)   Medical Service Provider Location Date of [place]  ZIP Code: [id number] June 02, 2020   "If data appears in the text box immediately above, information has been reported regarding a suspected overdose incident which may or may not relate to this patient. Please check further. If it is inaccurate, please report to [email or web address]"         Sela Hua, CASE MANAGER

## 2021-09-06 ENCOUNTER — Telehealth: Payer: MEDICAID | Admitting: Psychiatry

## 2021-09-06 ENCOUNTER — Telehealth: Payer: MEDICAID | Admitting: Social Worker

## 2021-09-06 ENCOUNTER — Other Ambulatory Visit: Payer: Self-pay

## 2021-09-06 DIAGNOSIS — Z79899 Other long term (current) drug therapy: Secondary | ICD-10-CM

## 2021-09-06 DIAGNOSIS — F1120 Opioid dependence, uncomplicated: Secondary | ICD-10-CM

## 2021-09-06 MED ORDER — BUPRENORPHINE 8 MG-NALOXONE 2 MG SUBLINGUAL FILM
1.0000 | ORAL_FILM | Freq: Two times a day (BID) | SUBLINGUAL | 0 refills | Status: DC
Start: 2021-09-06 — End: 2021-09-12

## 2021-09-06 NOTE — Progress Notes (Signed)
COAT TELE-Clinic Progress Note  TELEMEDICINE DOCUMENTATION:    Patient Location:  [address]  [place] [address]   Patient/family aware of provider location:  YES  Patient/family consent for telemedicine:  YES    Due to COVID-19 concerns patient presents for virtual follow-up to the COAT Clinic. The group modality was facilitated via Zoom.     The patient was seen as part of a collaborative telemedicine service with their COAT treatment team (case mgr, therapist, prescriber) who participated in the encounter by active presence via approved video/audio means for portions of the encounter.  This clinic visit included medication management, addiction education, and evaluation of progress in recovery.     This visit included extensive discussion regarding proper etiquette for use of tele in a group based format.    Examination observed and performed by:  Allayne Gitelman. Allyson Sabal, DO COAT Clinic Tele-Video-Audio Progress Note  Patient Name:  Cheryl [name]  MRN: [id number]  Date: 09/06/2021   Chief Complaint: "Addiction"  Current MAT: Buprenorphine   16 mg  Subjective:    Patient has been sober for 8 days (had to start clinic days over due to missing a urine drug screen).   Doing well. Keeping his routine. Son wants to play football. Trying to help out. Will be first year in high school. Going to meetings. Enjoying the [employer].  Celebrated a year of recovery on 5/26. Feeling good.   Cravings-None   Withdrawal Symptoms- None  Side Effects-None    Motivation is good    General Exam:  Patient is in no acute distress  alert and oriented      Mental Status:  Speech is clear and coherent.  Thought process is linear.  No indication of responding to internal stimuli.  Affect is stable  Attention is sustained  Concentration is sustained  Insight is good    Assessment:  Opioid Dependence 304.9 - Stable        Plan:  Continue COAT Clinic and medication.  [id number]  Coordination of Care:  Coordination of Care:  Patient was seen in  the COAT Clinic in the Department of Behavioral Medicine and Psychiatry at [facility] in [place], [place]. This clinic visit included medication management, addiction education, and evaluation of progress in recovery in a group setting. Patient's progress, psychosocial functioning and treatment response was discussed during the treatment team meeting with the case manager and/or therapist to assist in medical decision-making.    [name], DO 09/06/2021

## 2021-09-07 LAB — DRUG MONITORING, GABAPENTIN, QUANTITATIVE, URINE: GABAPENTIN: NEGATIVE ng/mL (ref <1000)

## 2021-09-07 LAB — NOTES AND COMMENTS

## 2021-09-10 NOTE — Progress Notes (Signed)
[facility] Medicine  Department of Behavioral Medicine and Psychiatry  [facility]  Comprehensive Opioid Addiction Treatment (COAT) Program    COAT GROUP THERAPY PROGRESS NOTE  NAME: Cheryl [name]  MRN: [id number]  Date of Service: 08/30/2021  TIME: 4:25pm - 5:25pm (60 minutes)  GROUP MEMBERS PRESENT: 5 Members   CPT CODE: 41660  CHIEF COMPLAINT: Group Therapy    TELEMEDICINE DOCUMENTATION:    Patient Location:  MyChart video visit from home address: [address]  [place] [address]    Patient/family aware of provider location:  yes  Patient/family consent for telemedicine:  yes  Examination observed and performed by:  Sherry Ruffing, LGSW    Subjective: Cheryl [name] was seen for group therapy, as part of the Comprehensive Opioid Addiction Treatment (COAT) Clinic. She reports having 1 days sober due to missed UDS, and reported attending any peer-led support meetings with psychiatrist. Group was held over [place]. Patients were educated on how to help protect their privacy as well as the privacy of other members within the group. Discussion was facilitated in an open format, as well as by individual check-in.     Topics discussed in group included providing group members space to process their week and gain support needed, naming one goal they had for the week to support their overall well-being, and introducing new members. Her goal is to make herself a country dinner and keep doing what has been working. She reports being grateful for being sober over a year now.      Observation:  Mood: euthymic  Affect: Congruent to mood  Thought processes: Cogent and eye contact is good  Appearance: Casually dressed   Behavior: Active participant     Assessment: (Dx) Opioid use disorder, on maintenance therapy    Procedure: Patient attended the weekly process and psychoeducational COAT Group, designed to be appropriate for individuals with opioid dependence in early recovery. The focus of this group to  educate about the process of  building a sound recovery program, to facilitate the understanding of the disease of addiction and how Medication Assisted Treatment (MAT) works to aid in the development of recovery, and to support the practice of new recovery tools. Encouragement of attendance at 12 step recovery meetings, identification of triggers for use/relapse and the development of a supportive social network were also covered. The following therapeutic techniques were utilized: CBT, mindfulness, motivational interviewing, feeling/emotion validation.    Plan: Abstinence, take medication as prescribed, and return to clinic for next scheduled appointment.    [address] Oletta Lamas, MSW, [place]  Associate Clinical Therapist  [place] Department of Behavioral Medicine and Psychiatry  09/10/2021 09:53    I have reviewed the note and agree with procedure and plan as documented.     Tanja Port, PhD, LICSW  Assistant Professor, Clinical Therapist  Department of Behavioral Medicine and Psychiatry

## 2021-09-10 NOTE — Progress Notes (Signed)
[facility] Medicine  Department of Behavioral Medicine and Psychiatry  [facility]  Comprehensive Opioid Addiction Treatment (COAT) Program    COAT GROUP THERAPY PROGRESS NOTE  NAME: Cheryl [name]  MRN: [id number]  Date of Service: 09/06/2021  TIME: 4:05pm - 5:00pm (55 minutes)  GROUP MEMBERS PRESENT: 4 Members   CPT CODE: 66440  CHIEF COMPLAINT: Group Therapy    TELEMEDICINE DOCUMENTATION:    Patient Location:  MyChart video visit from home address: [address]  [place] [address]    Patient/family aware of provider location:  yes  Patient/family consent for telemedicine:  yes  Examination observed and performed by:  Sherry Ruffing, LGSW    Subjective: Teagen was seen for group therapy, as part of the Comprehensive Opioid Addiction Treatment (COAT) Clinic. She reports having 8 days sober, and reported attending any peer-led support meetings with psychiatrist. Group was held over [place]. Patients were educated on how to help protect their privacy as well as the privacy of other members within the group. Discussion was facilitated in an open format, as well as by individual check-in.     Topics discussed in group included providing group members space to process their week and gain support needed, naming one goal they had for the week to support their overall well-being, and checking in on where they are in life. They also supported a member in need. Her goal is to sharpen her math skills and go to meetings. She reports being grateful being sober this year, and being able to get things for her son early instead of waiting to the last minute.      Observation:  Mood: euthymic  Affect: Congruent to mood  Thought processes: Cogent and eye contact is good  Appearance: Casually dressed   Behavior: Active participant     Assessment: (Dx) Opioid use disorder, on maintenance therapy    Procedure: Patient attended the weekly process and psychoeducational COAT Group, designed to be appropriate for  individuals with opioid dependence in early recovery. The focus of this group to educate about the process of  building a sound recovery program, to facilitate the understanding of the disease of addiction and how Medication Assisted Treatment (MAT) works to aid in the development of recovery, and to support the practice of new recovery tools. Encouragement of attendance at 12 step recovery meetings, identification of triggers for use/relapse and the development of a supportive social network were also covered. The following therapeutic techniques were utilized: CBT, mindfulness, motivational interviewing, feeling/emotion validation.    Plan: Abstinence, take medication as prescribed, and return to clinic for next scheduled appointment.    [address] Oletta Lamas, MSW, [place]  Associate Clinical Therapist  [place] Department of Behavioral Medicine and Psychiatry  09/10/2021 09:57    I have reviewed the note and agree with procedure and plan as documented.     Tanja Port, PhD, LICSW  Assistant Professor, Clinical Therapist  Department of Behavioral Medicine and Psychiatry

## 2021-09-12 NOTE — Progress Notes (Signed)
BEHAVIORAL MEDICINE, [place]  [address]  [facility] [place] [address]  [facility]  Telemedicine Visit    Name:  Cheryl [name] MRN: [id number]   Date:   09/13/2021 Age: 36 y.o.       TELEMEDICINE DOCUMENTATION:    Patient Location:  MyChart video visit from home    Patient/family aware of provider location:  yes  Patient/family consent for telemedicine:  yes  Examination observed and performed by:  [name], MD     I personally offered the service to the patient, and staff obtained verbal consent to provide this service.    Due to COVID-19 concerns patient presents for virtual follow-up to the COAT Clinic. The group modality was facilitated via Zoom.      Current MAT: Buprenorphine 16 mg  Subjective:    Patient reported sobriety of 15 clinic days but has not used for over a year. She has applied for a job and is on background checking. She reported doing well and had no specific complaint.     Motivation is good  Speech is clear and coherent.  Thought process is linear.  Attention is sustained     Assessment:  Opioid Dependence 304.9 - Stable                 Plan:  Continue COAT Clinic and medication.  [address]  Coordination of Care:  Patient was seen in the virtual COAT Clinic in the Department of Behavioral Medicine and Psychiatry at [facility] in [place], [place]. This clinic visit included medication management, addiction education, and evaluation of progress in recovery in a Zoom group setting.   Patient's progress, psychosocial functioning and treatment response was discussed during the treatment team meeting with the therapist and case manager to assist in medical decision-making.  This visit included extensive discussion regarding proper etiquette for use of tele in a group based format.  Total provider time spent with the patient on the video: 30 minutes in Zoom group.    [name], MD

## 2021-09-13 ENCOUNTER — Other Ambulatory Visit: Payer: Self-pay

## 2021-09-13 ENCOUNTER — Telehealth: Payer: MEDICAID | Admitting: Psychiatry

## 2021-09-13 ENCOUNTER — Telehealth: Payer: MEDICAID | Admitting: Social Worker

## 2021-09-13 DIAGNOSIS — F1120 Opioid dependence, uncomplicated: Secondary | ICD-10-CM

## 2021-09-13 MED ORDER — BUPRENORPHINE 8 MG-NALOXONE 2 MG SUBLINGUAL FILM
1.0000 | ORAL_FILM | Freq: Two times a day (BID) | SUBLINGUAL | 0 refills | Status: DC
Start: 2021-09-13 — End: 2021-09-20

## 2021-09-19 NOTE — Progress Notes (Signed)
[facility] Medicine  Department of Behavioral Medicine and Psychiatry  [facility]  Comprehensive Opioid Addiction Treatment (COAT) Program    COAT GROUP THERAPY PROGRESS NOTE  NAME: BRYAN OMURA  MRN: [id number]  Date of Service: 09/13/2021  TIME: 4:25pm - 5:15pm (50 minutes)  GROUP MEMBERS PRESENT: 4 Members   CPT CODE: 59935  CHIEF COMPLAINT: Group Therapy    TELEMEDICINE DOCUMENTATION:    Patient Location:  MyChart video visit from home address: [address]  [place] [address]    Patient/family aware of provider location:  yes  Patient/family consent for telemedicine:  yes  Examination observed and performed by:  Corena Herter, LGSW    Subjective: Lanny was seen for group therapy, as part of the Comprehensive Opioid Addiction Treatment (COAT) Clinic. She reports having 15 days sober, and reported attending any peer-led support meetings with psychiatrist. Group was held over Zoom. Patients were educated on how to help protect their privacy as well as the privacy of other members within the group. Discussion was facilitated in an open format, as well as by individual check-in.     Topics discussed in group included providing group members space to process their week and gain support needed, naming one goal they had for the week to support their overall well-being, and checking in on where they are in life. They talked about managing cravings and relapses with chronic physical pain and the difficulties of being in recovery during those times. Her goal is to go to more meetings and work on her weekend to do list. She reports being grateful sobriety and her family.      Observation:  Mood: euthymic  Affect: Congruent to mood  Thought processes: Cogent and eye contact is good  Appearance: Casually dressed   Behavior: Active participant     Assessment: (Dx) Opioid use disorder, on maintenance therapy    Procedure: Patient attended the weekly process and psychoeducational COAT Group,  designed to be appropriate for individuals with opioid dependence in early recovery. The focus of this group to educate about the process of  building a sound recovery program, to facilitate the understanding of the disease of addiction and how Medication Assisted Treatment (MAT) works to aid in the development of recovery, and to support the practice of new recovery tools. Encouragement of attendance at 12 step recovery meetings, identification of triggers for use/relapse and the development of a supportive social network were also covered. The following therapeutic techniques were utilized: CBT, mindfulness, motivational interviewing, feeling/emotion validation.    Plan: Abstinence, take medication as prescribed, and return to clinic for next scheduled appointment.    [address] Randa Evens, MSW, LGSW  Associate Clinical Therapist  [place] Department of Behavioral Medicine and Psychiatry  09/19/2021 10:22      I have reviewed the note and agree with procedure and plan as documented.     Carollee Herter, PhD, LICSW  Assistant Professor, Clinical Therapist  Department of Behavioral Medicine and Psychiatry

## 2021-09-20 ENCOUNTER — Telehealth: Payer: MEDICAID | Admitting: Social Worker

## 2021-09-20 ENCOUNTER — Encounter (HOSPITAL_COMMUNITY): Payer: Self-pay

## 2021-09-20 ENCOUNTER — Telehealth: Payer: MEDICAID | Admitting: Psychiatry

## 2021-09-20 ENCOUNTER — Other Ambulatory Visit: Payer: Self-pay

## 2021-09-20 DIAGNOSIS — F1120 Opioid dependence, uncomplicated: Secondary | ICD-10-CM

## 2021-09-20 DIAGNOSIS — Z79899 Other long term (current) drug therapy: Secondary | ICD-10-CM

## 2021-09-20 MED ORDER — BUPRENORPHINE 8 MG-NALOXONE 2 MG SUBLINGUAL FILM
1.0000 | ORAL_FILM | Freq: Two times a day (BID) | SUBLINGUAL | 0 refills | Status: DC
Start: 2021-09-20 — End: 2021-09-27

## 2021-09-20 NOTE — Progress Notes (Signed)
COAT TELE-Clinic Progress Note  TELEMEDICINE DOCUMENTATION:    Patient Location:  [address]  [place] [address]   Patient/family aware of provider location:  YES  Patient/family consent for telemedicine:  YES    Due to COVID-19 concerns patient presents for virtual follow-up to the COAT Clinic. The group modality was facilitated via Zoom.     The patient was seen as part of a collaborative telemedicine service with their COAT treatment team (case mgr, therapist, prescriber) who participated in the encounter by active presence via approved video/audio means for portions of the encounter.  This clinic visit included medication management, addiction education, and evaluation of progress in recovery.     This visit included extensive discussion regarding proper etiquette for use of tele in a group based format.    Examination observed and performed by:  Allayne Gitelman. Allyson Sabal, DO COAT Clinic Tele-Video-Audio Progress Note  Patient Name:  Cheryl [name]  MRN: [id number]  Date: 09/20/2021   Chief Complaint: "Addiction"  Current MAT: Buprenorphine   16 mg  Subjective:    Patient has been sober for 22 days.   Had a girl OD in the home. Had to be given narcan 3 times. Fortunately, she survived. Getting to all her meetings. Keeping her routine.   Cravings-None   Withdrawal Symptoms- None  Side Effects-None    Motivation is good    General Exam:  Patient is in no acute distress  alert and oriented      Mental Status:  Speech is clear and coherent.  Thought process is linear.  No indication of responding to internal stimuli.  Affect is stable  Attention is sustained  Concentration is sustained  Insight is good    Assessment:  Opioid Dependence 304.9 - Stable        Plan:  Continue COAT Clinic and medication.  [id number]  Coordination of Care:  Coordination of Care:  Patient was seen in the COAT Clinic in the Department of Behavioral Medicine and Psychiatry at [facility] in [place], [place]. This clinic visit included  medication management, addiction education, and evaluation of progress in recovery in a group setting. Patient's progress, psychosocial functioning and treatment response was discussed during the treatment team meeting with the case manager and/or therapist to assist in medical decision-making.    [name], DO 09/20/2021

## 2021-09-20 NOTE — Progress Notes (Signed)
[facility] Controlled Substance Full Name Report Report Date 09/20/2021   From 06/20/2021 To 09/19/2021 Date of Birth 12/17/1985 Prescription Count 11   Last Name Kos[place] First Name Amaryllis Middle Name                                              Patients included in report that appear to match the search criteria.   Last Name First Name Middle Name Gender Address   [place]   F [address] , [place], [place], [address]          Prescriber Name Prescriber DEA & Zip Dispenser Name Dispenser DEA & Zip Rx Written Date Rx Dispense Date & Date Sold    Rx Number Product Name MEDD Status Strength Qty Days # of Refill Sched Payment Type   Shawnique Mariotti [address] [place], [address]   [name] [id number] [phone] [place] [id number] [phone] 09/13/2021 09/18/2021 09/18/2021 [id number] Suboxone Film ACTIVE 8 mg/1; 2 mg/1 14 7  0/0 CIII Medicaid   [name] [id number] [address] [employer] [id number] [phone] 09/06/2021 09/11/2021 09/11/2021 [id number] Suboxone Film INACTIVE 8 mg/1; 2 mg/1 14 7  0/0 CIII Medicaid   [name] [id number] [address] [employer] [id number] [phone] 08/30/2021 09/04/2021 09/04/2021 [id number] Suboxone Film INACTIVE 8 mg/1; 2 mg/1 14 7  0/0 CIII Medicaid   [name] [id number] [address] [employer] [id number] [phone] 08/23/2021 08/27/2021 08/27/2021 [id number] Suboxone Film INACTIVE 8 mg/1; 2 mg/1 14 7  0/0 CIII Medicaid   [name] [id number] [address] [employer] [id number] [phone] 08/16/2021 08/21/2021 08/21/2021 [id number] Suboxone Film INACTIVE 8 mg/1; 2 mg/1 14 7  0/0 CIII Medicaid   [name], Fnp-Bc [id number] [phone] [place] [id number] [phone] 08/09/2021 08/13/2021 08/13/2021 [id number] Suboxone Film INACTIVE 8 mg/1; 2 mg/1 14 7  0/0 CIII Medicaid   [name] [id number] [phone] [place] [id number] [phone] 08/02/2021 08/06/2021 08/06/2021 [id number] Suboxone Film INACTIVE 8 mg/1; 2 mg/1 14 7  0/0 CIII Medicaid   [name] [id number] [phone] [employer] [id number] [phone] 07/27/2021 07/30/2021  07/30/2021 [id number] Suboxone Film INACTIVE 8 mg/1; 2 mg/1 12 6  0/0 CIII Medicaid   [name], Fnp-Bc [id number] [phone] [place] [id number] [phone] 07/17/2021 07/17/2021 07/17/2021 [id number] Suboxone Film INACTIVE 8 mg/1; 2 mg/1 20 10  0/0 CIII Medicaid   [name] [id number]  [place]. [id number] [phone] 07/10/2021 07/10/2021 07/11/2021 [id number] Suboxone Film INACTIVE 8 mg/1; 2 mg/1 14 7  0/0 CIII Medicaid   [name] Fnp-Bc [id number] [phone] [employer] [id number] 06/25/2021 06/25/2021 06/25/2021 [id number] Suboxone Film INACTIVE 8 mg/1; 2 mg/1 28 14  0/0 CIII Medicaid              Suspected Overdose Older Than 12 Months - As reported by [facility] medical service providers (EMS, ER, etc.)   Medical Service Provider Location Date of Incident   [facility] & ZIP Code: [id number] June 02, 2020   "If data appears in the text box immediately above, information has been reported regarding a suspected overdose incident which may or may not relate to this patient. Please check further. If it is inaccurate, please report to [email or web address]"              [name], CASE MANAGER

## 2021-09-24 NOTE — Progress Notes (Signed)
[facility] Medicine  Department of Behavioral Medicine and Psychiatry  [facility]  Comprehensive Opioid Addiction Treatment (COAT) Program    COAT GROUP THERAPY PROGRESS NOTE  NAME: Cheryl [name]  MRN: [id number]  Date of Service: 09/20/2021  TIME: 4:25pm - 5:15pm (50 minutes)  GROUP MEMBERS PRESENT: 6 Members   CPT CODE: 75643  CHIEF COMPLAINT: Group Therapy    TELEMEDICINE DOCUMENTATION:    Patient Location:  MyChart video visit from home address: [address]  [place] [address]    Patient/family aware of provider location:  yes  Patient/family consent for telemedicine:  yes  Examination observed and performed by:  Corena Herter, LGSW    Subjective: [name] was seen for group therapy, as part of the Comprehensive Opioid Addiction Treatment (COAT) Clinic. She reports having 22 days sober, and reported attending any peer-led support meetings with psychiatrist. Group was held over Zoom. Patients were educated on how to help protect their privacy as well as the privacy of other members within the group. Discussion was facilitated in an open format, as well as by individual check-in.     Topics discussed in group included providing group members space to process their week and gain support needed, naming one goal they had for the week to support their overall well-being, and checking in on where they are in life. They processed through one member's experience with an in-house overdose and how being around substances can be triggering and potentially lead them back to use. Her goal is to go to meetings. She reports being grateful for being alive.      Observation:  Mood: euthymic  Affect: Congruent to mood  Thought processes: Cogent and eye contact is good  Appearance: Casually dressed   Behavior: Active participant     Assessment: (Dx) Opioid use disorder, on maintenance therapy    Procedure: Patient attended the weekly process and psychoeducational COAT Group, designed to be appropriate  for individuals with opioid dependence in early recovery. The focus of this group to educate about the process of  building a sound recovery program, to facilitate the understanding of the disease of addiction and how Medication Assisted Treatment (MAT) works to aid in the development of recovery, and to support the practice of new recovery tools. Encouragement of attendance at 12 step recovery meetings, identification of triggers for use/relapse and the development of a supportive social network were also covered. The following therapeutic techniques were utilized: CBT, mindfulness, motivational interviewing, feeling/emotion validation.    Plan: Abstinence, take medication as prescribed, and return to clinic for next scheduled appointment.    [address] Randa Evens, MSW, LGSW  Associate Clinical Therapist  [place] Department of Behavioral Medicine and Psychiatry  09/24/2021 15:23    I have reviewed the note and agree with procedure and plan as documented.     Carollee Herter, PhD, LICSW  Assistant Professor, Clinical Therapist  Department of Behavioral Medicine and Psychiatry

## 2021-09-27 ENCOUNTER — Telehealth: Payer: MEDICAID | Admitting: Social Worker

## 2021-09-27 ENCOUNTER — Other Ambulatory Visit: Payer: Self-pay

## 2021-09-27 ENCOUNTER — Telehealth: Payer: MEDICAID | Admitting: Psychiatry

## 2021-09-27 DIAGNOSIS — F1120 Opioid dependence, uncomplicated: Secondary | ICD-10-CM

## 2021-09-27 DIAGNOSIS — Z79899 Other long term (current) drug therapy: Secondary | ICD-10-CM

## 2021-09-27 MED ORDER — BUPRENORPHINE 8 MG-NALOXONE 2 MG SUBLINGUAL FILM
1.0000 | ORAL_FILM | Freq: Two times a day (BID) | SUBLINGUAL | 0 refills | Status: DC
Start: 2021-09-27 — End: 2021-10-04

## 2021-09-27 NOTE — Progress Notes (Signed)
COAT TELE-Clinic Progress Note  TELEMEDICINE DOCUMENTATION:    Patient Location:  [address]  [place] [address]   Patient/family aware of provider location:  YES  Patient/family consent for telemedicine:  YES    Due to COVID-19 concerns patient presents for virtual follow-up to the COAT Clinic. The group modality was facilitated via Zoom.     The patient was seen as part of a collaborative telemedicine service with their COAT treatment team (case mgr, therapist, prescriber) who participated in the encounter by active presence via approved video/audio means for portions of the encounter.  This clinic visit included medication management, addiction education, and evaluation of progress in recovery.     This visit included extensive discussion regarding proper etiquette for use of tele in a group based format.    Examination observed and performed by:  Allayne Gitelman. Allyson Sabal, DO COAT Clinic Tele-Video-Audio Progress Note  Patient Name:  Cheryl [name]  MRN: [id number]  Date: 09/27/2021   Chief Complaint: "Addiction"  Current MAT: Buprenorphine   16 mg  Subjective:    Patient has been sober for 29 days.   "I'm doing great." Got a sponsor today. Shared the experience. Could relate well with this sponsor. First assignment is to get numbers and read the book.  Cravings-None   Withdrawal Symptoms- None  Side Effects-None    Motivation is good    General Exam:  Patient is in no acute distress  alert and oriented      Mental Status:  Speech is clear and coherent.  Thought process is linear.  No indication of responding to internal stimuli.  Affect is stable  Attention is sustained  Concentration is sustained  Insight is good    Assessment:  Opioid Dependence 304.9 - Stable        Plan:  Continue COAT Clinic and medication.  [address]  Coordination of Care:  Coordination of Care:  Patient was seen in the COAT Clinic in the Department of Behavioral Medicine and Psychiatry at [facility] in [place], [place]. This clinic visit  included medication management, addiction education, and evaluation of progress in recovery in a group setting. Patient's progress, psychosocial functioning and treatment response was discussed during the treatment team meeting with the case manager and/or therapist to assist in medical decision-making.    [name], DO 09/27/2021

## 2021-10-03 ENCOUNTER — Encounter (INDEPENDENT_AMBULATORY_CARE_PROVIDER_SITE_OTHER): Payer: Self-pay | Admitting: Psychiatry

## 2021-10-04 ENCOUNTER — Telehealth: Payer: MEDICAID | Admitting: Psychiatry

## 2021-10-04 ENCOUNTER — Telehealth: Payer: MEDICAID | Admitting: Social Worker

## 2021-10-04 ENCOUNTER — Other Ambulatory Visit: Payer: Self-pay

## 2021-10-04 DIAGNOSIS — Z79899 Other long term (current) drug therapy: Secondary | ICD-10-CM

## 2021-10-04 DIAGNOSIS — F1120 Opioid dependence, uncomplicated: Secondary | ICD-10-CM

## 2021-10-04 MED ORDER — BUPRENORPHINE 8 MG-NALOXONE 2 MG SUBLINGUAL FILM
1.0000 | ORAL_FILM | Freq: Two times a day (BID) | SUBLINGUAL | 0 refills | Status: DC
Start: 2021-10-04 — End: 2021-10-11

## 2021-10-04 NOTE — Progress Notes (Signed)
COAT TELE-Clinic Progress Note  TELEMEDICINE DOCUMENTATION:    Patient Location:  [address]  [place] [address]   Patient/family aware of provider location:  YES  Patient/family consent for telemedicine:  YES    Due to COVID-19 concerns patient presents for virtual follow-up to the COAT Clinic. The group modality was facilitated via Zoom.     The patient was seen as part of a collaborative telemedicine service with their COAT treatment team (case mgr, therapist, prescriber) who participated in the encounter by active presence via approved video/audio means for portions of the encounter.  This clinic visit included medication management, addiction education, and evaluation of progress in recovery.     This visit included extensive discussion regarding proper etiquette for use of tele in a group based format.    Examination observed and performed by:  Allayne Gitelman. Allyson Sabal, DO COAT Clinic Tele-Video-Audio Progress Note  Patient Name:  Cheryl [name]  MRN: [id number]  Date: 10/04/2021   Chief Complaint: "Addiction"  Current MAT: Buprenorphine   16 mg  Subjective:    Patient has been sober for 36 days.   Did not pass background check for job. Disappointed but taking it in stride. Hoping to have another opportunity. Encouraged her to contact [employer].  Did 3 meetings. Motivated by service work. Working with sponsor.   Cravings-None   Withdrawal Symptoms- None  Side Effects-None    Motivation is good    General Exam:  Patient is in no acute distress  alert and oriented      Mental Status:  Speech is clear and coherent.  Thought process is linear.  No indication of responding to internal stimuli.  Affect is stable  Attention is sustained  Concentration is sustained  Insight is good    Assessment:  Opioid Dependence 304.9 - Stable        Plan:  Continue COAT Clinic and medication.  [id number]  Coordination of Care:  Coordination of Care:  Patient was seen in the COAT Clinic in the Department of Behavioral Medicine and  Psychiatry at [facility] in [place], [place]. This clinic visit included medication management, addiction education, and evaluation of progress in recovery in a group setting. Patient's progress, psychosocial functioning and treatment response was discussed during the treatment team meeting with the case manager and/or therapist to assist in medical decision-making.    [name], DO 10/04/2021

## 2021-10-05 ENCOUNTER — Other Ambulatory Visit: Payer: MEDICAID | Attending: Psychiatry | Admitting: Psychiatry

## 2021-10-05 ENCOUNTER — Ambulatory Visit (INDEPENDENT_AMBULATORY_CARE_PROVIDER_SITE_OTHER): Payer: MEDICAID

## 2021-10-05 DIAGNOSIS — F1190 Opioid use, unspecified, uncomplicated: Secondary | ICD-10-CM

## 2021-10-05 DIAGNOSIS — Z5181 Encounter for therapeutic drug level monitoring: Secondary | ICD-10-CM

## 2021-10-05 LAB — CREATININE URINE, RANDOM: CREATININE RANDOM URINE: 110 mg/dL — ABNORMAL HIGH (ref 50–100)

## 2021-10-05 NOTE — Nursing Note (Signed)
10/05/21 0900   Drug Screen Results   Amphetamine (AMP) Negative   Barbiturates (BAR) Negative   BUP - Cut off Levels 10 ng/ml Positive   Benzodiazepine (BZO) Negative   Cocaine (COC) Negative   Methamphetamine (MET) Negative   Methadone (MTD) Negative   Morphine (MOP) Negative   Oxycodone (OXY) Negative   Propoxyphine (PPX) Negative   Marijuana (THC) Negative   Ecstasy (MDMA) Negative   Phencyclidine (PCP) Negative   Tricyclic Antidepressant (TCA) Negative   Temperature within range? yes   Observed no   Tester Jk   Physician Gwenlyn Found   Lot # [id number]   Expiration Date 04/27/2023   Internal Control Valid yes   Initials Jk     Days Sober Reported PRIOR to being asked for urine drug screen:  37  on 10/05/2021 with No Relapse       If relapse, on what? N/a     Other concerns to be notes: Patient would like to move to a Wednesday afternoon group if possible.     Patient sent out for ETGR, GABA , BUP, and FENT     [name], [place]  10/05/2021, 09:05

## 2021-10-08 LAB — SUBOXONE CONFIRMATORY/DEFINITIVE, URINE, BY LC-MS/MS (PERFORMABLE)
BUPRENORPHINE: 140 ng/mL — ABNORMAL HIGH (ref <10)
NALOXONE: 554 ng/mL — ABNORMAL HIGH (ref <5)
NORBUPRENORPHINE: 943 ng/mL — ABNORMAL HIGH (ref <10)

## 2021-10-08 LAB — FENTANYL CONFIRMATORY/DEFINITIVE, URINE, BY LC-MS/MS (PERFORMABLE)
FENTANYL INTERPRETATION: NEGATIVE
FENTANYL: NOT DETECTED ng/mL (ref <0.5)
NORFENTANYL: NOT DETECTED ng/mL (ref <2)

## 2021-10-10 LAB — ETHYL GLUCURONIDE & ETHYL SULFATE (ALCOHOL METABOLITES),URINE
CREATININE RANDOM URINE: 110 mg/dL — ABNORMAL HIGH (ref 50–100)
ETHYL GLUCURONIDE: NEGATIVE
ETHYL SULFATE: NEGATIVE

## 2021-10-10 LAB — ETHYL GLUCURONIDE AND ETHYL SULFATE, URINE
ETHYL GLUCURONIDE: NEGATIVE
ETHYL SULFATE: NEGATIVE

## 2021-10-10 NOTE — Progress Notes (Signed)
[name] Medicine  Department of Behavioral Medicine and Psychiatry  [facility]  Comprehensive Opioid Addiction Treatment (COAT) Program    COAT GROUP THERAPY PROGRESS NOTE  NAME: Cheryl [name]  MRN: [id number]  Date of Service: 09/27/2021  TIME: 4:25pm - 5:15pm (50 minutes)  GROUP MEMBERS PRESENT: 6 Members   CPT CODE: 33545  CHIEF COMPLAINT: Group Therapy    TELEMEDICINE DOCUMENTATION:    Patient Location:  MyChart video visit from home address: [address]  [place] [address]    Patient/family aware of provider location:  yes  Patient/family consent for telemedicine:  yes  Examination observed and performed by:  Corena Herter, LGSW    Subjective: Cheryl [name] was seen for group therapy, as part of the Comprehensive Opioid Addiction Treatment (COAT) Clinic. She reports having 29 days sober, and reported attending any peer-led support meetings with psychiatrist. Group was held over Zoom. Patients were educated on how to help protect their privacy as well as the privacy of other members within the group. Discussion was facilitated in an open format, as well as by individual check-in.     Topics discussed in group included providing group members space to process their week and gain support needed, naming one goal they had for the week to support their overall well-being, and checking in on where they are in life. Her goal is to go to meetings. She reports being grateful for being sober.      Observation:  Mood: euthymic  Affect: Congruent to mood  Thought processes: Cogent and eye contact is good  Appearance: Casually dressed   Behavior: Active participant     Assessment: (Dx) Opioid use disorder, on maintenance therapy    Procedure: Patient attended the weekly process and psychoeducational COAT Group, designed to be appropriate for individuals with opioid dependence in early recovery. The focus of this group to educate about the process of  building a sound recovery program, to  facilitate the understanding of the disease of addiction and how Medication Assisted Treatment (MAT) works to aid in the development of recovery, and to support the practice of new recovery tools. Encouragement of attendance at 12 step recovery meetings, identification of triggers for use/relapse and the development of a supportive social network were also covered. The following therapeutic techniques were utilized: CBT, mindfulness, motivational interviewing, feeling/emotion validation.    Plan: Abstinence, take medication as prescribed, and return to clinic for next scheduled appointment.    [address] Randa Evens, MSW, LGSW  Associate Clinical Therapist  [place] Department of Behavioral Medicine and Psychiatry  10/10/2021 09:37      I have reviewed the note and agree with procedure and plan as documented.     Carollee Herter, PhD, LICSW  Assistant Professor, Clinical Therapist  Department of Behavioral Medicine and Psychiatry

## 2021-10-10 NOTE — Progress Notes (Signed)
[facility] Medicine  Department of Behavioral Medicine and Psychiatry  [facility]  Comprehensive Opioid Addiction Treatment (COAT) Program    COAT GROUP THERAPY PROGRESS NOTE  NAME: Cheryl [name]  MRN: [id number]  Date of Service: 10/04/2021  TIME: 4:25pm - 5:15pm (50 minutes)  GROUP MEMBERS PRESENT: 6 Members   CPT CODE: 54627  CHIEF COMPLAINT: Group Therapy    TELEMEDICINE DOCUMENTATION:    Patient Location:  MyChart video visit from home address: [address]  [place] [address]    Patient/family aware of provider location:  yes  Patient/family consent for telemedicine:  yes  Examination observed and performed by:  Corena Herter, LGSW    Subjective: Cheryl [name] was seen for group therapy, as part of the Comprehensive Opioid Addiction Treatment (COAT) Clinic. She reports having 36 days sober, and reported attending any peer-led support meetings with psychiatrist. Group was held over Zoom. Patients were educated on how to help protect their privacy as well as the privacy of other members within the group. Discussion was facilitated in an open format, as well as by individual check-in.     Topics discussed in group included providing group members space to process their week and gain support needed, naming one goal they had for the week to support their overall well-being, and checking in on where they are in life. Her goal is to go to meetings. She reports being grateful for being sober and having a job, even if she didn't get the one she wanted.      Observation:  Mood: euthymic  Affect: Congruent to mood  Thought processes: Cogent and eye contact is good  Appearance: Casually dressed   Behavior: Active participant     Assessment: (Dx) Opioid use disorder, on maintenance therapy    Procedure: Patient attended the weekly process and psychoeducational COAT Group, designed to be appropriate for individuals with opioid dependence in early recovery. The focus of this group to educate about  the process of  building a sound recovery program, to facilitate the understanding of the disease of addiction and how Medication Assisted Treatment (MAT) works to aid in the development of recovery, and to support the practice of new recovery tools. Encouragement of attendance at 12 step recovery meetings, identification of triggers for use/relapse and the development of a supportive social network were also covered. The following therapeutic techniques were utilized: CBT, mindfulness, motivational interviewing, feeling/emotion validation.    Plan: Abstinence, take medication as prescribed, and return to clinic for next scheduled appointment.    [address]" Randa Evens, MSW, LGSW  Associate Clinical Therapist  [place] Department of Behavioral Medicine and Psychiatry  10/10/2021 09:42      I have reviewed the note and agree with procedure and plan as documented.     Carollee Herter, PhD, LICSW  Assistant Professor, Clinical Therapist  Department of Behavioral Medicine and Psychiatry

## 2021-10-11 ENCOUNTER — Telehealth: Payer: MEDICAID | Admitting: Social Worker

## 2021-10-11 ENCOUNTER — Other Ambulatory Visit: Payer: Self-pay

## 2021-10-11 ENCOUNTER — Telehealth: Payer: MEDICAID | Admitting: Psychiatry

## 2021-10-11 DIAGNOSIS — F1120 Opioid dependence, uncomplicated: Secondary | ICD-10-CM

## 2021-10-11 DIAGNOSIS — Z79899 Other long term (current) drug therapy: Secondary | ICD-10-CM

## 2021-10-11 MED ORDER — BUPRENORPHINE 8 MG-NALOXONE 2 MG SUBLINGUAL FILM
1.0000 | ORAL_FILM | Freq: Two times a day (BID) | SUBLINGUAL | 0 refills | Status: DC
Start: 2021-10-11 — End: 2021-10-18

## 2021-10-11 NOTE — Progress Notes (Signed)
COAT TELE-Clinic Progress Note  TELEMEDICINE DOCUMENTATION:    Patient Location:  [address]  [place] [address]   Patient/family aware of provider location:  YES  Patient/family consent for telemedicine:  YES    Due to COVID-19 concerns patient presents for virtual follow-up to the COAT Clinic. The group modality was facilitated via Zoom.     The patient was seen as part of a collaborative telemedicine service with their COAT treatment team (case mgr, therapist, prescriber) who participated in the encounter by active presence via approved video/audio means for portions of the encounter.  This clinic visit included medication management, addiction education, and evaluation of progress in recovery.     This visit included extensive discussion regarding proper etiquette for use of tele in a group based format.    Examination observed and performed by:  Cheryl [name]. Cheryl Sabal, DO COAT Clinic Tele-Video-Audio Progress Note  Patient Name:  Cheryl [name]  MRN: [id number]  Date: 10/11/2021   Chief Complaint: "Addiction"  Current MAT: Buprenorphine   16 mg  Subjective:    Patient has been sober for 43 days.   "I've been doing great!" Feeling good. Eating more fruits and vegetables. Has more energy. Watching old [employer]. Getting to her meetings. Plans on going to church on Sundays which will count as a meeting.   Cravings-None   Withdrawal Symptoms- None  Side Effects-None    Motivation is good    General Exam:  Patient is in no acute distress  alert and oriented      Mental Status:  Speech is clear and coherent.  Thought process is linear.  No indication of responding to internal stimuli.  Affect is stable  Attention is sustained  Concentration is sustained  Insight is good    Assessment:  Opioid Dependence 304.9 - Stable        Plan:  Continue COAT Clinic and medication.  [address]  Coordination of Care:  Coordination of Care:  Patient was seen in the COAT Clinic in the Department of Behavioral Medicine and Psychiatry at  [facility] in [place], [place]. This clinic visit included medication management, addiction education, and evaluation of progress in recovery in a group setting. Patient's progress, psychosocial functioning and treatment response was discussed during the treatment team meeting with the case manager and/or therapist to assist in medical decision-making.    Cheryl Bradford, DO 10/11/2021

## 2021-10-12 LAB — NOTES AND COMMENTS

## 2021-10-12 LAB — DRUG MONITORING, GABAPENTIN, QUANTITATIVE, URINE: GABAPENTIN: NEGATIVE ng/mL (ref <1000)

## 2021-10-16 ENCOUNTER — Encounter (HOSPITAL_COMMUNITY): Payer: Self-pay

## 2021-10-16 NOTE — Progress Notes (Signed)
[facility] [place] Controlled Substance Full Name Report Report Date 10/16/2021   From 07/17/2021 To 10/15/2021 Date of Birth November 17, 1985 Prescription Count 12   Last Name [name] First Name Cheryl Middle Name                                              Patients included in report that appear to match the search criteria.   Last Name First Name Middle Name Gender Address   [place]   [address] , [place], [place], [address]          Prescriber Name Prescriber DEA & Zip Dispenser Name Dispenser DEA & Zip Rx Written Date Rx Dispense Date & Date Sold    Rx Number Product Name MEDD Status Strength Qty Days # of Refill Sched Payment Type   Sayde Lish [address] [place], [address]   [name] [id number] [address] [place]. [id number] [phone] 10/04/2021 10/08/2021 10/08/2021 [id number] Suboxone Film INACTIVE 8 mg/1; 2 mg/1 14 7  0/0 CIII Medicaid   [name] [id number] [address] [employer] [id number] [phone] 09/27/2021 10/02/2021 10/02/2021 [id number] Suboxone Film INACTIVE 8 mg/1; 2 mg/1 14 7  0/0 CIII Medicaid   [name] [id number] [address] [employer] [id number] [phone] 09/20/2021 09/25/2021 09/25/2021 [id number] Suboxone Film INACTIVE 8 mg/1; 2 mg/1 14 7  0/0 CIII Medicaid   [name] [id number] [phone] [employer] [id number] [phone] 09/13/2021 09/18/2021 09/18/2021 [id number] Suboxone Film INACTIVE 8 mg/1; 2 mg/1 14 7  0/0 CIII Medicaid   [name] [id number] [address] [employer] [id number] [phone] 09/06/2021 09/11/2021 09/11/2021 [id number] Suboxone Film INACTIVE 8 mg/1; 2 mg/1 14 7  0/0 CIII Medicaid   [name] [id number] [address] [employer] [id number] [phone] 08/30/2021 09/04/2021 09/04/2021 [id number] Suboxone Film INACTIVE 8 mg/1; 2 mg/1 14 7  0/0 CIII Medicaid   [name] [id number] [address] [employer] [id number] [phone] 08/23/2021 08/27/2021 08/27/2021 [id number] Suboxone Film INACTIVE 8 mg/1; 2 mg/1 14 7  0/0 CIII Medicaid   [name] [id number] [address] [employer] [id number] [phone] 08/16/2021 08/21/2021 08/21/2021 [id number] Suboxone Film  INACTIVE 8 mg/1; 2 mg/1 14 7  0/0 CIII Medicaid   [name], Fnp-Bc [id number] [phone] [place] [id number] [phone] 08/09/2021 08/13/2021 08/13/2021 [id number] Suboxone Film INACTIVE 8 mg/1; 2 mg/1 14 7  0/0 CIII Medicaid   [name] [id number] [phone] [place] [id number] [phone] 08/02/2021 08/06/2021 08/06/2021 [id number] Suboxone Film INACTIVE 8 mg/1; 2 mg/1 14 7  0/0 CIII Medicaid   [name] [id number] [phone] [place] [id number] [phone] 07/27/2021 07/30/2021 07/30/2021 [id number] Suboxone Film INACTIVE 8 mg/1; 2 mg/1 12 6  0/0 CIII Medicaid   [name], Fnp-Bc [id number] [phone] [place] [id number] [phone] 07/17/2021 07/17/2021 07/17/2021 [id number] Suboxone Film INACTIVE 8 mg/1; 2 mg/1 20 10  0/0 CIII Medicaid              Suspected Overdose Older Than 12 Months - As reported by [facility] medical service providers (EMS, ER, etc.)   Medical Service Provider Location Date of Incident   [facility] ZIP Code: [id number] June 02, 2020   "If data appears in the text box immediately above, information has been reported regarding a suspected overdose incident which may or may not relate to this patient. Please check further. If it is inaccurate, please report to [email or web address]"  Sela Hua, CASE MANAGER

## 2021-10-18 ENCOUNTER — Telehealth: Payer: MEDICAID | Admitting: Emergency Medicine

## 2021-10-18 ENCOUNTER — Other Ambulatory Visit: Payer: Self-pay

## 2021-10-18 ENCOUNTER — Telehealth: Payer: MEDICAID | Admitting: FAMILY PRACTICE

## 2021-10-18 DIAGNOSIS — Z79899 Other long term (current) drug therapy: Secondary | ICD-10-CM

## 2021-10-18 DIAGNOSIS — F1120 Opioid dependence, uncomplicated: Secondary | ICD-10-CM

## 2021-10-18 MED ORDER — BUPRENORPHINE 8 MG-NALOXONE 2 MG SUBLINGUAL FILM
1.0000 | ORAL_FILM | Freq: Two times a day (BID) | SUBLINGUAL | 0 refills | Status: DC
Start: 2021-10-18 — End: 2021-10-25

## 2021-10-18 NOTE — Progress Notes (Signed)
COAT TELE-Clinic Progress Note  TELEMEDICINE DOCUMENTATION:    Patient Location:  [address]  [place] [address]   Patient/family aware of provider location:  YES  Patient/family consent for telemedicine:  YES    Due to COVID-19 concerns patient presents for virtual follow-up to the COAT Clinic. The group modality was facilitated via Zoom.     The patient was seen as part of a collaborative telemedicine service with their COAT treatment team (case mgr, therapist, prescriber) who participated in the encounter by active presence via approved video/audio means for portions of the encounter.  This clinic visit included medication management, addiction education, and evaluation of progress in recovery.     This visit included extensive discussion regarding proper etiquette for use of tele in a group based format.    Examination observed and performed by:  [name], MD COAT Clinic Tele-Video-Audio Progress Note  Patient Name:  Cheryl [name]  MRN: [id number]  Date: 10/18/2021   Chief Complaint: "Addiction"  Current MAT: Buprenorphine   16 mg  Subjective:    Patient has been sober for 50 days.  Had a tough roommate in sober living who was very paranoid coming off of meth, reminded her of what she used to be like.  Gets to see her son in a couple of weeks, is caught up on finances.  Doing well.  Cravings-None   Withdrawal Symptoms- None  Side Effects-None    Motivation is good    General Exam:  Patient is in no acute distress  alert and oriented      Mental Status:  Speech is clear and coherent.  Thought process is linear.  No indication of responding to internal stimuli.  Affect is stable  Attention is sustained  Concentration is sustained  Insight is good    Assessment:  Opioid Dependence 304.9 - Stable        Plan:  Continue COAT Clinic and medication.  [address]  Coordination of Care:  Coordination of Care:  Patient was seen in the COAT Clinic in the Department of Behavioral Medicine and Psychiatry at [facility] in [place], [place]. This clinic visit included medication management, addiction education, and evaluation of progress in recovery in a group setting. Patient's progress, psychosocial functioning and treatment response was discussed during the treatment team meeting with the case manager and/or therapist to assist in medical decision-making.    [name], MD

## 2021-10-19 NOTE — Progress Notes (Signed)
TELEMEDICINE DOCUMENTATION:    Patient Location:  MyChart video visit from home address: [address]  [place] [address]    Patient/family aware of provider location:  yes  Patient/family consent for telemedicine:  yes  Examination observed and performed by:  Maralyn Sago, [facility]    [facility] COAT GROUP THERAPY PROGRESS NOTE  NAME: Asta, Corbridge  MR: [id number]  Date of Service: 10/18/21  TIME: 4:35 p.m. to 5:30 p.m.  GROUP MEMBERS PRESENT: 7  CPT CODE: 83338  CHIEF COMPLAINT: Group Therapy    Subjective: Patient seen for group therapy, as part of the Comprehensive Opioid Addiction Treatment (COAT) Clinic; this group was conducted via Zoom.  This patient reported having 43clinic days, and is engaged in a personal recovery plan.     Along with individual check-ins, we discussed coping strategies to use when feeling overwhelmed in life. This patient reflected on her excitement in being able to save up money to provide for her son in ways she couldn't before recovery.    Observation:   Mood: Level  Affect: Broad  Thought processes:   Cogent and eye contact is good  Participation: Active    Assessment: Opioid Use Disorder, severe, on maintenance therapy    Procedure: Patient attended the [facility] COAT Group. The focus of this group to educate about the process of building a sound recovery program, to facilitate the understanding of the disease of addiction and how Medication Assisted Treatment (MAT) works to aid in the development of recovery, and to support the practice of new recovery tools. Encouragement of attendance at peer recovery meetings, identification of triggers for use/relapse and the development of a supportive social network were also covered. The following therapeutic techniques were utilized: MI and CBT.     Plan: Abstinence, take medication as prescribed, and return to clinic for next scheduled appointment.     Estanislado Spire, LPC, ALPS, AADC  Clinical Therapist

## 2021-10-25 ENCOUNTER — Telehealth: Payer: MEDICAID | Admitting: Psychiatry

## 2021-10-25 ENCOUNTER — Other Ambulatory Visit: Payer: Self-pay

## 2021-10-25 DIAGNOSIS — F1120 Opioid dependence, uncomplicated: Secondary | ICD-10-CM

## 2021-10-25 DIAGNOSIS — Z79899 Other long term (current) drug therapy: Secondary | ICD-10-CM

## 2021-10-25 MED ORDER — BUPRENORPHINE 8 MG-NALOXONE 2 MG SUBLINGUAL FILM
1.0000 | ORAL_FILM | Freq: Two times a day (BID) | SUBLINGUAL | 0 refills | Status: DC
Start: 2021-10-25 — End: 2021-11-01

## 2021-10-25 NOTE — Progress Notes (Signed)
COAT TELE-Clinic Progress Note  TELEMEDICINE DOCUMENTATION:    Patient Location:  [address]  [place] [address]   Patient/family aware of provider location:  YES  Patient/family consent for telemedicine:  YES    Due to COVID-19 concerns patient presents for virtual follow-up to the COAT Clinic. The group modality was facilitated via Zoom.     The patient was seen as part of a collaborative telemedicine service with their COAT treatment team (case mgr, therapist, prescriber) who participated in the encounter by active presence via approved video/audio means for portions of the encounter.  This clinic visit included medication management, addiction education, and evaluation of progress in recovery.     This visit included extensive discussion regarding proper etiquette for use of tele in a group based format.    Examination observed and performed by:  Allayne Gitelman. Allyson Sabal, DO COAT Clinic Tele-Video-Audio Progress Note  Patient Name:  Cheryl [name]  MRN: [id number]  Date: 10/25/2021   Chief Complaint: "Addiction"  Current MAT: Buprenorphine   16 mg  Subjective:    Patient has been sober for 50 days.   Excited to spend time with son next week. Working a lot. Doing her meetings and following general guidelines to stay sober.   Cravings-None   Withdrawal Symptoms- None  Side Effects-None    Motivation is good    General Exam:  Patient is in no acute distress  alert and oriented      Mental Status:  Speech is clear and coherent.  Thought process is linear.  No indication of responding to internal stimuli.  Affect is stable  Attention is sustained  Concentration is sustained  Insight is good    Assessment:  Opioid Dependence 304.9 - Stable        Plan:  Continue COAT Clinic and medication.  [address]  Coordination of Care:  Coordination of Care:  Patient was seen in the COAT Clinic in the Department of Behavioral Medicine and Psychiatry at [facility] in [place], [place]. This clinic visit included medication  management, addiction education, and evaluation of progress in recovery in a group setting. Patient's progress, psychosocial functioning and treatment response was discussed during the treatment team meeting with the case manager and/or therapist to assist in medical decision-making.    [name], DO 10/25/2021

## 2021-10-31 ENCOUNTER — Ambulatory Visit (HOSPITAL_COMMUNITY): Payer: Self-pay

## 2021-10-31 NOTE — Telephone Encounter (Signed)
Person notified to present to [facility] 430[facility], [facility] by 330pm on 11/05/21 for OBSERVED (unless absolutely not possible)  urine drug screen.  Pt to be sent out for ETGR, GABA , BUP and FENT       Deshonda Cryderman, CT  10/31/2021, 14:09

## 2021-11-01 ENCOUNTER — Ambulatory Visit (HOSPITAL_COMMUNITY): Payer: MEDICAID | Admitting: Social Worker

## 2021-11-01 ENCOUNTER — Other Ambulatory Visit: Payer: Self-pay

## 2021-11-01 ENCOUNTER — Telehealth: Payer: MEDICAID | Admitting: Family

## 2021-11-01 DIAGNOSIS — F1120 Opioid dependence, uncomplicated: Secondary | ICD-10-CM

## 2021-11-01 DIAGNOSIS — Z79899 Other long term (current) drug therapy: Secondary | ICD-10-CM

## 2021-11-01 MED ORDER — BUPRENORPHINE 8 MG-NALOXONE 2 MG SUBLINGUAL FILM
1.0000 | ORAL_FILM | Freq: Two times a day (BID) | SUBLINGUAL | 0 refills | Status: DC
Start: 2021-11-01 — End: 2021-11-07

## 2021-11-01 NOTE — Progress Notes (Signed)
COAT Clinic Progress Note    Patient Name:  Cheryl [name]   MRN: [id number]    Date of Service: 11/01/2021    TELEMEDICINE DOCUMENTATION:    Patient Location:  MyChart video visit from home address: [address]  [place] [address]    Patient/family aware of provider location:  yes  Patient/family consent for telemedicine:  yes  Examination observed and performed by:  Kristen Loader, APRN        Due to COVID-19 concerns, Cheryl [name] presents for virtual follow-up to the weekly Tele-COAT Clinic by the Department of Behavioral Medicine and Psychiatry at [facility] in [place], [place]. The group modality was facilitated via Zoom. This clinic visit included medication management, addiction education, and evaluation of progress in recovery in a group setting. The patient was seen as part of a collaborative telemedicine service with their COAT treatment team (case mgr, therapist, prescriber) who participated in the encounter by active presence via approved video/audio means for portions of the encounter.      Subjective:     She has been sober for 57 days. Patient attended 3 meetings and other activities associated with maintaining sobriety since last visit. Patient says she is doing well. She recently road a bus to visit her son and will be staying with him for a few days. She is looking forward to the visit.    Patient reports tolerance of Suboxone as:      Cravings-None      Side Effects-None      Withdrawal Symptoms-None        Objective:  VS deferred    Via video, patient:  Appears stated age; no acute distress; well kempt and casually dressed.  Alert and oriented to person, place, time, and situation.  Expression is relaxed, maintains eye contact.  Speech is clear and coherent, with normal rate, latency, and volume.  Mood is fair to good; affect is congruent with mood.  Thought process is linear.  No indication of responding to internal stimuli.      Assessment:  Opioid use disorder,  on  maintenance therapy                                  Plan:    1. Integrated discussion in group regarding the following: a) recovery maintenance, including recognition of potential triggers and strategies for avoidance of/coping with triggers; b) continued recovery work, including attendance at required 12-step meetings; and, c) general health maintenance, including adequate rest and exercise.  2. Recommend attendance at 4 recovery meetings, even if online.  3. Will plan on continuing current Suboxone regimen, 16 mg daily.      Coordination of Care:  Patient's progress, psychosocial functioning and treatment response was discussed during the treatment team meeting with the therapist and case manager to assist in medical decision-making.  Visit length: 30 minutes       Kristen Loader, APRN  11/01/2021, 17:22

## 2021-11-03 ENCOUNTER — Emergency Department (HOSPITAL_COMMUNITY): Payer: MEDICAID

## 2021-11-03 ENCOUNTER — Emergency Department (EMERGENCY_DEPARTMENT_HOSPITAL): Payer: MEDICAID

## 2021-11-03 ENCOUNTER — Emergency Department
Admission: EM | Admit: 2021-11-03 | Discharge: 2021-11-03 | Disposition: A | Discharge status: Home / Self Care | Payer: MEDICAID | Attending: Emergency Medicine | Admitting: Emergency Medicine

## 2021-11-03 ENCOUNTER — Other Ambulatory Visit: Payer: Self-pay

## 2021-11-03 DIAGNOSIS — M5136 Other intervertebral disc degeneration, lumbar region: Secondary | ICD-10-CM

## 2021-11-03 DIAGNOSIS — M9983 Other biomechanical lesions of lumbar region: Secondary | ICD-10-CM

## 2021-11-03 DIAGNOSIS — M545 Low back pain, unspecified: Secondary | ICD-10-CM | POA: Insufficient documentation

## 2021-11-03 DIAGNOSIS — M549 Dorsalgia, unspecified: Secondary | ICD-10-CM

## 2021-11-03 DIAGNOSIS — R1900 Intra-abdominal and pelvic swelling, mass and lump, unspecified site: Secondary | ICD-10-CM

## 2021-11-03 DIAGNOSIS — M4317 Spondylolisthesis, lumbosacral region: Secondary | ICD-10-CM

## 2021-11-03 DIAGNOSIS — Z6833 Body mass index (BMI) 33.0-33.9, adult: Secondary | ICD-10-CM

## 2021-11-03 DIAGNOSIS — F1120 Opioid dependence, uncomplicated: Secondary | ICD-10-CM | POA: Insufficient documentation

## 2021-11-03 DIAGNOSIS — M5126 Other intervertebral disc displacement, lumbar region: Secondary | ICD-10-CM

## 2021-11-03 DIAGNOSIS — M48061 Spinal stenosis, lumbar region without neurogenic claudication: Secondary | ICD-10-CM | POA: Insufficient documentation

## 2021-11-03 MED ORDER — LIDOCAINE 4 % TOPICAL PATCH
1.0000 | MEDICATED_PATCH | Freq: Every day | CUTANEOUS | 0 refills | Status: AC
Start: 2021-11-03 — End: ?

## 2021-11-03 MED ORDER — METHYLPREDNISOLONE 4 MG TABLETS IN A DOSE PACK
ORAL_TABLET | ORAL | 0 refills | Status: AC
Start: 2021-11-03 — End: ?

## 2021-11-03 NOTE — Discharge Instructions (Addendum)
Please follow-up with orthopedic and spine surgery  Address: [facility]  [address]  [place], [place] [address]    General Instructions:  You are considered stable/appropriate for discharge from the emergency department.  Please carefully follow all the instructions you were given verbally as well as the written instructions given below.  In general, unless discussed otherwise, the following is generally appropriate: immediately return to the emergency department if the symptoms you presented with today increase in severity and/or do not improve in what you consider an acceptable time frame. Return if you develop fever >101 F, vomiting, inability to tolerate food/liquids, chest pain, shortness of breath, weakness, change in behavior, or any other concerns.    Medication(s) Instructions (if applicable):  If you were given any medication(s) upon discharge, please strictly follow the directions as prescribed for taking the medication(s).  Should you feel you develop any type of reaction to the medication(s), including, but not limited to, rash, swelling of the lips or face or difficulty breathing, immediately discontinue the use of the medication(s) and seek prompt medical care.  Please read the medication(s) insert provided by the pharmacy and follow all guidelines and recommendations.  Please take all medications as prescribed for your symptoms or diagnoses.  New Prescriptions    No medications on file                   Follow-Up Instructions:  If you were given instructions to follow-up with a health care provider upon discharge, please be sure to do so.  Please take a copy of your discharge papers with you to your follow-up appointment(s).  It is your responsibility to call and/or make an appointment with the health care providers listed on your discharge papers and/or your primary care provider in the appropriate time frame given.  Please follow up with your primary care physician in 7  days or earlier if needed for your symptoms.  Please follow up with any specialist provider in clinic that your were given instructions to see as an outpatient.     YOU MUST CALL THE NUMBER LISTED ON THE DISCHARGE PAPERWORK TO CONFIRM YOUR APPOINTMENT.    Special Information / Instructions:  Please read and follow all attached discharge instructions.      Please return to the Emergency Department if you have persistence or worsening of your symptoms.

## 2021-11-03 NOTE — ED Nurses Note (Signed)
Verbal report received from previous RN. Assuming patient care at this time.

## 2021-11-03 NOTE — ED Nurses Note (Signed)
Pt left unit ambulatory with discharge instructions. Pt verbalized understanding of instructions given. No s/s distress noted at time of discharge.

## 2021-11-03 NOTE — ED Attending Note (Shared)
Department of Emergency Medicine  [facility] - Emergency Department  Attending Primary Provider Note    Name: Cheryl [name]  Age and Gender: 36 y.o. female  Date of Birth: 1985/05/23  Date of Service: 11/03/2021   MRN: [id number]  PCP: Lisette Abu, MD     History                                                 Chief Complaint   Patient presents with   . Low Back Pain     c/o low back pain for months. has been getting wrong. her sister had to have  back surgery, so she would like xrays. denies numbness or tingling.  denies incontinence     HPI:  Cheryl [name] is a 36 y.o. female presenting with ***.  ***    ROS:  Review of systems performed and negative except as outlined in the HPI above    Below pertinent information reviewed with patient and/or EMR:  ***History has not been "Marked as Reviewed" - Go to the top of the ED Triage Tab to document your review. Make sure history sections are updated and accurate - Click Here to jump to History for update.***    No Known Allergies    Objective     Vitals Recorded in This Encounter         11/03/2021  1412             BP: 119/81    Pulse: 82    Resp: 18    Temp: 36.2 C (97.2 F)    SpO2: 98 %            PHYSICAL EXAM:  ***  GENERAL:  Alert conscious, in NAD. Well nourished.   HEENT:  NCAT, PERRLA 4 mm bilaterally.  Sclerae are non-icteric, conjunctivae are pink,   NECK:  Full range of motion no nuchal rigidity  CHEST:  Equal chest rise and fall, no accessory muscle use  ABDOMEN:  Soft nontender nondistended  EXTREMITIES:    Full range of motion is present,   SKIN: Warm, dry, pink mucosal membranes  NEURO:  A+O to person, place, time, and events. GCS 15. CN II-Xll are intact grossly. strength 5/5 bilateral upper and lower extremities     Labs:   Labs Ordered/Reviewed - No data to display    Imaging:  No orders to display           Procedures     Procedures    Medical Decision Making / ED course     Cheryl [name] is a 36 y.o. female who presented  with ***.    Given patients history, current symptoms, and exam; concern for, but not limited to, ***.  Patient has a {low/medium/high:25693} likelihood of decompensation due to the current condition.  ***       Medical Decision Making      Management and treatment decisions made amidst COVID-19 public health emergency; admission vs. discharge standard has necessarily shifted.     Clinical Impression:    Clinical Impression   None       Medications given:       Disposition:   Data Unavailable    Follow up:    No follow-up provider specified.    Parts of this patients chart were  completed in a retrospective fashion due to simultaneous direct patient care activities in the Emergency Department.   This note was partially generated using MModal Fluency Direct system, and there may be some incorrect words, spellings, and punctuation that were not noted in checking the note before saving.      ***  Gordy Clement, MD

## 2021-11-03 NOTE — ED Provider Notes (Signed)
[name] - Emergency Department  Attending Primary Provider Note    Subjective   Chief Complaint   Patient presents with    Low Back Pain     c/o low back pain for months. has been getting wrong. her sister had to have  back surgery, so she would like xrays. denies numbness or tingling.  denies incontinence       HPI:    History Limitations: none    Cheryl [name] is a 36 y.o. female with PMH of ovarian surgery, polysubstance abuse, on Suboxone, anxiety, presenting with lower back pain.    Patient states that she has had chronic low back pain for approximately 7 years.  However last 3 months that it increased in severity and frequency.  Notes that she has a difficult time getting out of bed, takes approximately 10 minutes.  Occasionally will take Tylenol ibuprofen, does not leave pain.  Prefers flexion, as it is slightly alleviates pain.  Denies saddle anesthesia, changes in bowel or urinary frequency/incontinence, changes in sensation lower extremity, numbness/tingling.  Denies trauma to the lumbar.  States that day she will have her son helped her get out of bed.  Notes past substance abuse, currently on Suboxone.  Denies IV drug use now or in past, preferred smoking heroin has been clean for 1 year.        Past Medical History:  No past medical history on file.    Past Surgical History:  Past Surgical History:   Procedure Laterality Date    HX CESAREAN SECTION      OVARY SURGERY         Review of Systems   Constitutional:  Negative for chills, fever and weight loss.   Musculoskeletal:  Positive for back pain (lower lumbar). Negative for joint pain.   Neurological:  Negative for tingling, sensory change and weakness.       Below pertinent information reviewed with patient and/or EMR:  Medications Prior to Admission       Prescriptions    buprenorphine-naloxone (SUBOXONE) 8-2 mg Sublingual Film    Place 1 Film under the tongue Twice daily Indications: prevention of relapse to opioid  dependence    busPIRone (BUSPAR) 10 mg Oral Tablet    Take 2 Tablets (20 mg total) by mouth Twice daily    citalopram (CELEXA) 20 mg Oral Tablet    Take 1 Tablet (20 mg total) by mouth Once a day    cloNIDine HCL (CATAPRES) 0.1 mg Oral Tablet    Take 1 Tablet (0.1 mg total) by mouth Three times a day as needed (opioid withdrawal) Indications: symptoms from stopping treatment with opioid drugs    QUEtiapine (SEROQUEL) 100 mg Oral Tablet    Take 1 Tablet (100 mg total) by mouth Three times a day          No Known Allergies  Past Surgical History:   Procedure Laterality Date    HX CESAREAN SECTION      OVARY SURGERY       Family Medical History:    None       Social History     Tobacco Use    Smoking status: Unknown   Substance Use Topics    Alcohol use: Never    Drug use: Yes     Types: Heroin, Amphetamine          Objective   ED Triage Vitals [11/03/21 1412]   BP (Non-Invasive) 119/81   Heart Rate  82   Respiratory Rate 18   Temperature 36.2 C (97.2 F)   SpO2 98 %   Weight 98.5 kg (217 lb 2.5 oz)   Height 1.727 m (5\' 8" )     Physical Exam  Constitutional:       Appearance: Normal appearance. She is obese.   HENT:      Head: Atraumatic.   Cardiovascular:      Rate and Rhythm: Normal rate and regular rhythm.      Heart sounds: Normal heart sounds.   Pulmonary:      Effort: Pulmonary effort is normal.      Breath sounds: Normal breath sounds.   Musculoskeletal:         General: Tenderness (thoracic and lumbar on spinal process) present. No signs of injury.      Left lower leg: No edema.   Skin:     General: Skin is warm and dry.   Neurological:      Mental Status: She is alert.                       MDM/Plan:       Imaging Results:  CT lumbar WO contrast:  No acute fracture or traumatic malalignment of the lumbar spine.   Mild changes of the lumbar spine. Minimal retrolisthesis of L5 on S1. Possible disc bulge and slight ligamentum flavum thickening at L4-L5 with at least mild-to-moderate narrowing of the spinal canal.  Mild to moderate bilateral L5-S1 osseous neural foraminal narrowing.     Partially imaged fat density lesion in left side of the pelvic cavity, measuring up to 1.5 cm in size, not well evaluated on this exam    My interpretation of EKG: none indicated at this time     Labs and Interpretation: none indicated at this time    On arrival, patient appears stable.  Afebrile and hemodynamically stable.  Alert and oriented x4, pleasant, and cooperative. Presents with low back pain as above.    On exam, patient resting comfortably in bed.  Did not appear to be in acute distress.  Acute on chronic back pain, considering length of symptoms, 7 years.  Concern for spinal stenosis versus radiculopathy from bulging disc versus OA versus cauda equina.  Patient exhibits no red flag symptoms, with saddle anesthesia, change of bowel or urinary habits, no numbness or tingling in legs.  No recent trauma.  Concern for spinal stenosis due to patient's preferred flexion position an exacerbation with supine in bed.  Patient seen today, as pain increased in was looking for etiology.  In accordance with imaging, showing a possible disc bulge from L4-L5 with some narrowing of the spinal canal, could be source of pain.  Additionally noted on imaging, was a small 1.5 cm in size that density lesion.  Patient states that she has past history of ovarian cysts.  Patient notes that she will be moving to [facility].  Provided all or spine Institute in OB in area.    The patient was fully informed and involved with history taking, evaluation, workup including labs/images, and plan.  The patient's concerns and questions were addressed to the patient's satisfaction and she expressed agreement with the plan to follow up with OB and Spine in [facility].         Clinical Impression:     Clinical Impression   Back pain, unspecified back location, unspecified back pain laterality, unspecified chronicity (Primary)  Medications given:        Disposition: Discharged    Procedures: none    Critical Care: none    Follow up:    Heywood Footman, DO  [address]  [place] [address]  [phone]    Schedule an appointment as soon as possible for a visit   As needed      Rebeca Alert, DO 11/03/2021, 15:28/  PGY-1 Emergency Medicine  [facility]  Pager # [phone] - [employer]        Parts of this patients chart were completed in a retrospective fashion due to simultaneous direct patient care activities in the Emergency Department.   This note was partially generated using MModal Fluency Direct system, and there may be some incorrect words, spellings, and punctuation that were not noted in checking the note before saving.

## 2021-11-03 NOTE — ED Attending Handoff Note (Signed)
Care assumed from Dr. Rock Nephew:    36 y.o. femalei wth low back pain.  Severe today- pain for years.  No red flag symptoms.  CT lumbar ordered.  F/u with spine.     ED Course:  Disc bulge with mild canal, and moderate foraminal narrowing.  Also poorly defined 1.5 cm pelvic mass not well defined.  Will follow-up with OB and spine in new area.  Patient was discharged with medication for pain control.    Cheryl Darner, MD

## 2021-11-03 NOTE — ED Resident Handoff Note (Signed)
Came with low back pain (acute on chronic)    Today it was more severe.    Has Hx of substance abuse.    No red flags.    CT lumbar with contrast ordered      Patient was discharged with a 30 day script for lidocaine patch

## 2021-11-06 ENCOUNTER — Encounter (INDEPENDENT_AMBULATORY_CARE_PROVIDER_SITE_OTHER): Payer: Self-pay

## 2021-11-06 ENCOUNTER — Other Ambulatory Visit: Payer: Self-pay

## 2021-11-06 ENCOUNTER — Ambulatory Visit (INDEPENDENT_AMBULATORY_CARE_PROVIDER_SITE_OTHER): Payer: MEDICAID

## 2021-11-06 VITALS — BP 121/81 | HR 66 | Temp 98.9°F | Resp 18 | Ht 68.0 in | Wt 218.0 lb

## 2021-11-06 DIAGNOSIS — T07XXXA Unspecified multiple injuries, initial encounter: Secondary | ICD-10-CM

## 2021-11-06 DIAGNOSIS — S0033XA Contusion of nose, initial encounter: Secondary | ICD-10-CM

## 2021-11-06 DIAGNOSIS — Z6833 Body mass index (BMI) 33.0-33.9, adult: Secondary | ICD-10-CM
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
## 2021-11-08 ENCOUNTER — Other Ambulatory Visit: Payer: Self-pay

## 2021-11-08 ENCOUNTER — Telehealth: Payer: MEDICAID | Admitting: Social Worker

## 2021-11-08 ENCOUNTER — Telehealth: Payer: MEDICAID | Admitting: Psychiatry

## 2021-11-08 DIAGNOSIS — F1120 Opioid dependence, uncomplicated: Secondary | ICD-10-CM

## 2021-11-08 DIAGNOSIS — Z79899 Other long term (current) drug therapy: Secondary | ICD-10-CM

## 2021-11-08 MED ORDER — BUPRENORPHINE 8 MG-NALOXONE 2 MG SUBLINGUAL FILM
1.0000 | ORAL_FILM | Freq: Two times a day (BID) | SUBLINGUAL | 0 refills | Status: DC
Start: 2021-11-08 — End: 2021-11-15

## 2021-11-08 NOTE — Progress Notes (Signed)
Patient didn't present for group, took early out. Opened in error.    Corena Herter, LGSW 11/08/2021 17:47

## 2021-11-08 NOTE — Progress Notes (Signed)
[facility] Medicine  Department of Behavioral Medicine and Psychiatry  [facility]  Comprehensive Opioid Addiction Treatment (COAT) Program    COAT GROUP THERAPY PROGRESS NOTE  NAME: Cheryl [name]  MRN: [id number]  Date of Service: 11/08/2021  TIME: 4:30pm - 5:20pm (50 minutes)  GROUP MEMBERS PRESENT: 9 Members   CPT CODE: 90853  CHIEF COMPLAINT: Group Therapy    TELEMEDICINE DOCUMENTATION:    Patient Location:  MyChart video visit from home address: [address]  [place] [address]    Patient/family aware of provider location:  yes  Patient/family consent for telemedicine:  yes  Examination observed and performed by:  Corena Herter, LGSW    Subjective: Cheryl [name] was seen for group therapy, as part of the Comprehensive Opioid Addiction Treatment (COAT) Clinic. She reports having 64 days sober, and reported attending any peer-led support meetings with psychiatrist. Group was held over Zoom. Patients were educated on how to help protect their privacy as well as the privacy of other members within the group. Discussion was facilitated in an open format, as well as by individual check-in.     Topics discussed in group included providing group members space to process their week and gain support needed, naming one goal they had for the week to support their overall well-being, and checking in on where they are in life. Today, the group hosted a new associate clinical therapist that was shadowing, and group members shared what has been most useful for them in their experience within COAT and other recovery based groups. Her goal is to eat healthier. She reports being grateful for getting to move back home near her mom and son.      Observation:  Mood: euthymic  Affect: Congruent to mood  Thought processes: Cogent and eye contact is good  Appearance: Casually dressed   Behavior: Active participant     Assessment: (Dx) Opioid use disorder, on maintenance therapy    Procedure: Patient attended the weekly  process and psychoeducational COAT Group, designed to be appropriate for individuals with opioid dependence in early recovery. The focus of this group to educate about the process of  building a sound recovery program, to facilitate the understanding of the disease of addiction and how Medication Assisted Treatment (MAT) works to aid in the development of recovery, and to support the practice of new recovery tools. Encouragement of attendance at 12 step recovery meetings, identification of triggers for use/relapse and the development of a supportive social network were also covered. The following therapeutic techniques were utilized: CBT, mindfulness, motivational interviewing, feeling/emotion validation.    Plan: Abstinence, take medication as prescribed, and return to clinic for next scheduled appointment.    [address]" Randa Evens, MSW, LGSW  Associate Clinical Therapist  [facility] Department of Behavioral Medicine and Psychiatry  11/08/2021 17:42      I have reviewed the note and agree with procedure and plan as documented.     Carollee Herter, PhD, LICSW  Assistant Professor, Clinical Therapist  Department of Behavioral Medicine and Psychiatry

## 2021-11-14 ENCOUNTER — Ambulatory Visit (HOSPITAL_COMMUNITY): Payer: Self-pay

## 2021-11-14 NOTE — Telephone Encounter (Signed)
Person notified by phone only (no MyChart) to present to [facility] by 4pm, [facility] by 330pm on 11/16/21 for OBSERVED (unless absolutely not possible)  urine drug screen.  Pt to be sent out for ETGR, GABA , BUP, and FENT         Cheryl [name], CT  11/14/2021, 12:26

## 2021-11-15 ENCOUNTER — Ambulatory Visit (HOSPITAL_COMMUNITY): Payer: MEDICAID | Admitting: Social Worker

## 2021-11-15 ENCOUNTER — Telehealth: Payer: MEDICAID | Admitting: Psychiatry

## 2021-11-15 ENCOUNTER — Other Ambulatory Visit: Payer: Self-pay

## 2021-11-15 DIAGNOSIS — F1120 Opioid dependence, uncomplicated: Secondary | ICD-10-CM

## 2021-11-15 DIAGNOSIS — Z79899 Other long term (current) drug therapy: Secondary | ICD-10-CM

## 2021-11-15 MED ORDER — BUPRENORPHINE 8 MG-NALOXONE 2 MG SUBLINGUAL FILM
1.0000 | ORAL_FILM | Freq: Two times a day (BID) | SUBLINGUAL | 0 refills | Status: DC
Start: 2021-11-15 — End: 2021-11-26

## 2021-11-15 NOTE — Progress Notes (Signed)
COAT TELE-Clinic Progress Note  TELEMEDICINE DOCUMENTATION:    Patient Location:  [address]  [place] [address]   Patient/family aware of provider location:  YES  Patient/family consent for telemedicine:  YES    Due to COVID-19 concerns patient presents for virtual follow-up to the COAT Clinic. The group modality was facilitated via Zoom.     The patient was seen as part of a collaborative telemedicine service with their COAT treatment team (case mgr, therapist, prescriber) who participated in the encounter by active presence via approved video/audio means for portions of the encounter.  This clinic visit included medication management, addiction education, and evaluation of progress in recovery.     This visit included extensive discussion regarding proper etiquette for use of tele in a group based format.    Examination observed and performed by:  Allayne Gitelman. Allyson Sabal, DO COAT Clinic Tele-Video-Audio Progress Note  Patient Name:  Cheryl [name]  MRN: [id number]  Date: 11/15/2021   Chief Complaint: "Addiction"  Current MAT: Buprenorphine   16 mg  Subjective:    Patient has been sober for 71 days.   "Everything is O.K." Plans on moving this Sunday. Has an appointment for new MAT program in [place]. Satisfied with recovery.   Cravings-None   Withdrawal Symptoms- None  Side Effects-None    Motivation is good    General Exam:  Patient is in no acute distress  alert and oriented      Mental Status:  Speech is clear and coherent.  Thought process is linear.  No indication of responding to internal stimuli.  Affect is stable  Attention is sustained  Concentration is sustained  Insight is good    Assessment:  Opioid Dependence 304.9 - Stable        Plan:  Continue COAT Clinic and medication.  [address]  Coordination of Care:  Coordination of Care:  Patient was seen in the COAT Clinic in the Department of Behavioral Medicine and Psychiatry at [facility] in [place], [place]. This clinic visit included medication management,  addiction education, and evaluation of progress in recovery in a group setting. Patient's progress, psychosocial functioning and treatment response was discussed during the treatment team meeting with the case manager and/or therapist to assist in medical decision-making.    [name], DO 11/15/2021

## 2021-11-20 ENCOUNTER — Encounter (HOSPITAL_COMMUNITY): Payer: Self-pay

## 2021-11-20 NOTE — Progress Notes (Signed)
[facility]  [place] Controlled Substance Full Cheryl [name] Report Report Date 11/20/2021   From 08/20/2021 To 11/19/2021 Date of Birth [birth year] Prescription Count 14   Last Cheryl [name] [place] First Cheryl [name] Cheryl [name]                                      Patients included in report that appear to match the search criteria.   Last Cheryl [name] First Cheryl [name] Middle Cheryl [name] Gender Address   [place] Cheryl Cheryl [name]   F [address] , [place], [place], [address]            Prescriber Cheryl [name] Prescriber DEA & Zip Dispenser Cheryl [name] Dispenser DEA & Zip Rx Written Date Rx Dispense Date  & Date Sold    Rx Number Product Cheryl [name] MEDD Status Strength Qty Days # of Refill Sched Payment Type   Cheryl Cheryl [name] [address], [place], [address]   Cheryl Cheryl [name] [id number] [address] [place]. [id number] [phone] 11/15/2021 11/18/2021 11/18/2021 [id number] Suboxone Film ACTIVE 8 mg/1; 2 mg/1 14 7  0/0 CIII Medicaid   Cheryl Cheryl [name] [id number] [address] [employer] [id number] [phone] 11/08/2021 11/12/2021 11/12/2021 [id number] Suboxone Film INACTIVE 8 mg/1; 2 mg/1 14 7  0/0 CIII Medicaid   Cheryl Morgans, Fnp-Bc [id number] [phone] [place] [id number] [phone] 11/01/2021 11/04/2021 11/04/2021 [id number] Suboxone Film INACTIVE 8 mg/1; 2 mg/1 14 7  0/0 CIII Medicaid   Cheryl Cheryl [name] [id number] [address] [employer] [id number] [phone] 10/25/2021 10/28/2021 10/28/2021 [id number] Suboxone Film INACTIVE 8 mg/1; 2 mg/1 14 7  0/0 CIII Medicaid   Cheryl Cheryl [name] [id number]  [place]. [id number] [phone] 10/18/2021 10/22/2021 10/22/2021 [id number] Suboxone Film INACTIVE 8 mg/1; 2 mg/1 14 7  0/0 CIII Medicaid   Cheryl Cheryl [name] [id number] [address] [employer] [id number] [phone] 10/11/2021 10/15/2021 10/15/2021 [id number] Suboxone Film INACTIVE 8 mg/1; 2 mg/1 14 7  0/0 CIII Medicaid   Cheryl Cheryl [name] [id number] 26[address] [employer] [id number] [phone] 10/04/2021 10/08/2021 10/08/2021 [id number] Suboxone Film INACTIVE 8 mg/1; 2 mg/1 14 7  0/0 CIII Medicaid   Cheryl Cheryl [name] [id number] 26[address] [employer] [id number] [phone] 09/27/2021 10/02/2021 10/02/2021 [id number] Suboxone Film INACTIVE 8  mg/1; 2 mg/1 14 7  0/0 CIII Medicaid   Cheryl Cheryl [name] [id number] 26[address] [employer] [id number] [phone] 09/20/2021 09/25/2021 09/25/2021 [id number] Suboxone Film INACTIVE 8 mg/1; 2 mg/1 14 7  0/0 CIII Medicaid   Cheryl Cheryl [name] [id number] [phone] [employer] [id number] [phone] 09/13/2021 09/18/2021 09/18/2021 [id number] Suboxone Film INACTIVE 8 mg/1; 2 mg/1 14 7  0/0 CIII Medicaid   Cheryl Cheryl [name] [id number] [address] [employer] [id number] [phone] 09/06/2021 09/11/2021 09/11/2021 [id number] Suboxone Film INACTIVE 8 mg/1; 2 mg/1 14 7  0/0 CIII Medicaid   Cheryl Cheryl [name] [id number] [address] [employer] [id number] [phone] 08/30/2021 09/04/2021 09/04/2021 [id number] Suboxone Film INACTIVE 8 mg/1; 2 mg/1 14 7  0/0 CIII Medicaid   Cheryl Cheryl [name] [id number] 26[address] [employer] [id number] [phone] 08/23/2021 08/27/2021 08/27/2021 [id number] Suboxone Film INACTIVE 8 mg/1; 2 mg/1 14 7  0/0 CIII Medicaid   Cheryl Cheryl [name] [id number] [address] [place] [id number] [phone] 08/16/2021 08/21/2021 08/21/2021 [id number] Suboxone Film INACTIVE 8 mg/1; 2 mg/1 14 7  0/0 CIII Medicaid            Suspected Overdose Older Than 12 Months - As reported by [facility] medical service providers (EMS, ER, etc.)   Medical Service Provider Location Date of Incident   [facility]  &  ZIP Code: [address] June 02, 2020   "If data  appears in the text box immediately above, information has been reported regarding a suspected overdose incident which may or may not relate to this patient. Please check further. If it is inaccurate, please report to [email or web address]"              [name], CASE MANAGER

## 2021-11-22 ENCOUNTER — Encounter (HOSPITAL_COMMUNITY): Payer: Self-pay

## 2021-11-22 ENCOUNTER — Ambulatory Visit (HOSPITAL_COMMUNITY): Payer: MEDICAID | Admitting: Social Worker

## 2021-11-22 ENCOUNTER — Telehealth: Payer: MEDICAID | Admitting: Family

## 2021-11-22 ENCOUNTER — Other Ambulatory Visit: Payer: Self-pay

## 2021-11-22 DIAGNOSIS — F1120 Opioid dependence, uncomplicated: Secondary | ICD-10-CM

## 2021-11-22 NOTE — Progress Notes (Signed)
Patient did not present for visit; encounter opened in error.

## 2021-11-23 ENCOUNTER — Ambulatory Visit (INDEPENDENT_AMBULATORY_CARE_PROVIDER_SITE_OTHER): Payer: Self-pay

## 2021-11-23 NOTE — Telephone Encounter (Signed)
Patient missed COAT group yesterday and called too late for makeup group today.  CM called in 8-2mg  Suboxone films; take 2 films SLQD; #6 films; no refills to [facility] in [place] under Dr. Hazle Coca DEA.     Patient will be scheduled for a makeup on Monday, Aug. 21 and return to her COAT group on Thursday 8/24    Cheryl [name], CT

## 2021-11-26 ENCOUNTER — Other Ambulatory Visit: Payer: Self-pay

## 2021-11-26 ENCOUNTER — Telehealth: Payer: MEDICAID | Admitting: Psychiatry

## 2021-11-26 DIAGNOSIS — Z79899 Other long term (current) drug therapy: Secondary | ICD-10-CM

## 2021-11-26 DIAGNOSIS — F1120 Opioid dependence, uncomplicated: Secondary | ICD-10-CM

## 2021-11-26 MED ORDER — BUPRENORPHINE 8 MG-NALOXONE 2 MG SUBLINGUAL FILM
1.0000 | ORAL_FILM | Freq: Two times a day (BID) | SUBLINGUAL | 0 refills | Status: DC
Start: 2021-11-26 — End: 2021-11-29

## 2021-11-26 NOTE — Nursing Note (Signed)
Person notified to present to [facility] by 6:30 pm or to [place] by 3:30 pm on 11/28/21 for OBSERVED (unless absolutely not possible)  urine drug screen.  Pt to be sent out for ETGR, GABA , BUP, and FENT     Pt reported 82 days with No Relapse      Tiburcio Pea, Ambulatory Care Assistant  11/26/2021, 10:59

## 2021-11-26 NOTE — Progress Notes (Signed)
COAT Clinic Progress Note  Video visit, via zoom  TELEMEDICINE DOCUMENTATION:    Patient Location:  video visit from home address: [address]  [place] [address]    Patient/family aware of provider location:  yes  Patient/family consent for telemedicine:  yes  Examination observed and performed by:  Tyrone Schimke, MD     Patient Name:  Cheryl [name]  MRN: [id number]  Date: 11/26/2021  Chief Complaint: "Addiction"  Current MAT: Buprenorphine   16mg   Subjective:    Patient has been sober for 82 days . Patient attended 2 meetings since last visit.  Pt reports that she went to stay with her niece but found out she is drinking alcohol and using substances so she came back to this area and is now in a sober living home.   She is looking for work and was interested in jobs and hope and reintegr[place] which the case coordinator will help her with.  She is focused on recovery and recognizes that social group reconstruction is critical to her success.   Cravings-None   Withdrawal Symptoms- None  Side Effects-None    Motivation is good    General Exam:  Patient is in no acute distress, alert and oriented, and dressed appropriately.  LMP 11/03/2021         Mental Status:  Speech is clear and coherent.  Thought process is linear.  No indication of responding to internal stimuli.  Affect is stable  Attention is sustained  Concentration is sustained  Insight is good    Assessment:  Opioid Dependence 304.9 - Stable    Plan:  Continue COAT Clinic and medication.  Coordination of Care:  Patient was seen in the COAT Clinic in the Department of Behavioral Medicine and Psychiatry by video. This clinic visit included medication management, addiction education, and evaluation of progress in recovery in a group setting.   Patient's progress, psychosocial functioning and treatment response was discussed during the treatment team meeting with the therapist and case manager to assist in medical decision-making.    11/05/2021,  MD 11/26/2021

## 2021-11-27 ENCOUNTER — Other Ambulatory Visit: Payer: MEDICAID | Attending: Psychiatry | Admitting: Psychiatry

## 2021-11-27 ENCOUNTER — Ambulatory Visit (INDEPENDENT_AMBULATORY_CARE_PROVIDER_SITE_OTHER): Payer: MEDICAID

## 2021-11-27 ENCOUNTER — Other Ambulatory Visit (HOSPITAL_COMMUNITY): Payer: Self-pay

## 2021-11-27 DIAGNOSIS — F1120 Opioid dependence, uncomplicated: Secondary | ICD-10-CM | POA: Insufficient documentation

## 2021-11-27 DIAGNOSIS — Z5181 Encounter for therapeutic drug level monitoring: Secondary | ICD-10-CM

## 2021-11-27 LAB — CREATININE URINE, RANDOM: CREATININE RANDOM URINE: 84 mg/dL (ref 50–100)

## 2021-11-27 NOTE — Ancillary Notes (Cosign Needed)
[facility] Department of [place] Medicine and Psychiatry  Brief Note    Cheryl [name]  1986-01-24  [id number]    Date of service: 11/27/21    INTERVAL  Called by MARS line about patient's Suboxone, however appears to be error with pharmacy because they report patient fill a 3 day prescription yesterday. On chart review she has appointment on 8/24 and this prescription should reach. Also had a visit yesterday with Dr. Marylene Land where presumed to have sent another prescription.     PLAN  Instructed MARS to return if patient needs medication.     Ofilia Neas, MD 11/27/21 20:09  Psychiatry PGY-2  [facility] Controlled Substance Full Name Report Report Date 11/27/2021   From 11/27/2020 To 11/26/2021 Date of Birth 10-Nov-1985 Prescription Count 31   Last Name [name] First Name Cheryl Middle Name                                                             Patients included in report that appear to match the search criteria.   Last Name First Name Middle Name Gender Address   Cheryl [name]   F [address] , [place], [place], [address]                               Cheryl Overdose in the Past 12 Months - As reported by [facility] medical service providers (EMS, ER, etc.)   Medical Service Provider Location Date of Incident   "If data appears in the text box immediately above, information has been reported regarding an overdose incident which may or may not relate to this patient. Please check further. If it is inaccurate, please report to [email or web address]"                           Prescriber Name Prescriber DEA & Zip Dispenser Name Dispenser DEA & Zip Rx Written Date Rx Dispense Date  & Date Sold    Rx Number Product Name MEDD Status Strength Qty Days # of Refill Sched Payment Type   [place], [place] [id number] [address] [employer] [id number] [phone] 11/15/2021 11/18/2021 11/18/2021 [id number] Suboxone Film INACTIVE 8 mg/1; 2 mg/1 14 7   0/0 CIII Medicaid   [name] [id number] [address] [employer] [id number] [phone] 11/08/2021 11/12/2021 11/12/2021 [id number] Suboxone Film INACTIVE 8 mg/1; 2 mg/1 14 7  0/0 CIII Medicaid   Cheryl Morgans, Fnp-Bc [id number] [phone] [place] [id number] [phone] 11/01/2021 11/04/2021 11/04/2021 [id number] Suboxone Film INACTIVE 8 mg/1; 2 mg/1 14 7  0/0 CIII Medicaid   [name] [id number] [address] [employer] [id number] [phone] 10/25/2021 10/28/2021 10/28/2021 [id number] Suboxone Film INACTIVE 8 mg/1; 2 mg/1 14 7  0/0 CIII Medicaid   Cheryl [name] [id number]  [place]. [id number] [phone] 10/18/2021 10/22/2021 10/22/2021 [id number] Suboxone Film INACTIVE 8 mg/1; 2 mg/1 14 7  0/0 CIII Medicaid   [name] [id number] 26[address] [employer] [id number] [phone] 10/11/2021 10/15/2021 10/15/2021 [id number] Suboxone Film INACTIVE  8 mg/1; 2 mg/1 14 7  0/0 CIII Medicaid   [name] [id number] 26[address] [employer] [id number] [phone] 10/04/2021 10/08/2021 10/08/2021 [id number] Suboxone Film INACTIVE 8 mg/1; 2 mg/1 14 7  0/0 CIII Medicaid   [name] [id number] 26[address] [employer] [id number] [phone] 09/27/2021 10/02/2021 10/02/2021 [id number] Suboxone Film INACTIVE 8 mg/1; 2 mg/1 14 7  0/0 CIII Medicaid   [name] [id number] 26[address] [employer] [id number] [phone] 09/20/2021 09/25/2021 09/25/2021 [id number] Suboxone Film INACTIVE 8 mg/1; 2 mg/1 14 7  0/0 CIII Medicaid   [name] [id number] [phone] [employer] [id number] [phone] 09/13/2021 09/18/2021 09/18/2021 [id number] Suboxone Film INACTIVE 8 mg/1; 2 mg/1 14 7  0/0 CIII Medicaid   [name] [id number] 26[address] [employer] [id number] [phone] 09/06/2021 09/11/2021 09/11/2021 [id number] Suboxone Film INACTIVE 8 mg/1; 2 mg/1 14 7  0/0 CIII Medicaid   [name] [id number] [address] [employer] [id number] [phone] 08/30/2021 09/04/2021 09/04/2021 [id number] Suboxone Film INACTIVE 8 mg/1; 2 mg/1 14 7  0/0 CIII Medicaid   [name] [id number] 26[address] [employer] [id number] [phone] 08/23/2021 08/27/2021 08/27/2021 [id number] Suboxone Film INACTIVE 8 mg/1; 2 mg/1 14 7  0/0 CIII  Medicaid   Cheryl [name] [id number] 26[address] [employer] [id number] [phone] 08/16/2021 08/21/2021 08/21/2021 [id number] Suboxone Film INACTIVE 8 mg/1; 2 mg/1 14 7  0/0 CIII Medicaid   Cheryl Morgans, Fnp-Bc [id number] [phone] [place] [id number] [phone] 08/09/2021 08/13/2021 08/13/2021 [id number] Suboxone Film INACTIVE 8 mg/1; 2 mg/1 14 7  0/0 CIII Medicaid   Cheryl [name] (Dnp) [id number] [phone] [employer] [id number] [phone] 08/02/2021 08/06/2021 08/06/2021 [id number] Suboxone Film INACTIVE 8 mg/1; 2 mg/1 14 7  0/0 CIII Medicaid   Cheryl [name] (Dnp) [id number] [phone] [employer]. [id number] [phone] 07/27/2021 07/30/2021 07/30/2021 [id number] Suboxone Film INACTIVE 8 mg/1; 2 mg/1 12 6  0/0 CIII Medicaid   Cheryl Morgans, Fnp-Bc [id number] [phone] [place] [id number] [phone] 07/17/2021 07/17/2021 07/17/2021 [id number] Suboxone Film INACTIVE 8 mg/1; 2 mg/1 20 10  0/0 CIII Medicaid   Cheryl [name] [id number]  [place]. [id number] [phone] 07/10/2021 07/10/2021 07/11/2021 [id number] Suboxone Film INACTIVE 8 mg/1; 2 mg/1 14 7  0/0 CIII Medicaid   Cheryl [name]., Fnp-Bc [id number] [phone] [employer] [id number] 06/25/2021 06/25/2021 06/25/2021 [id number] Suboxone Film INACTIVE 8 mg/1; 2 mg/1 28 14  0/0 CIII Medicaid   Cheryl [name]., Fnp-Bc [id number] [phone] [employer] [id number] 06/11/2021 06/11/2021 06/11/2021 [id number] Suboxone Film INACTIVE 8 mg/1; 2 mg/1 28 14  0/0 CIII Medicaid   Cheryl [name]., Fnp-Bc [id number] [phone] [employer] [id number] 12244 05/29/2021 05/29/2021 05/29/2021 [id number] Suboxone Film INACTIVE 8 mg/1; 2 mg/1 30 15  0/0 CIII Medicaid   Cheryl [name] [id number] [address] [employer] [id number] [id number] 05/16/2021 05/17/2021 05/17/2021 [id number] Suboxone Film INACTIVE 8 mg/1; 2 mg/1 28 14  0/0 CIII Medicaid   Cheryl [name] [id number] [address] [employer] [id number] [id number] 05/10/2021 05/14/2021 05/14/2021 [id number] Suboxone Film INACTIVE 8 mg/1; 2 mg/1 6 3  0/0 CIII Medicaid   Cheryl [name]., Fnp-Bc [id number] [phone] [employer] [id number] 04/30/2021 05/01/2021 05/01/2021 [id number] Narcan INACTIVE 4 MG/.1ML 2 2 0/0 R Medicaid   Cheryl [name]., Fnp-Bc [id number] [phone] [employer] [id number] 04/30/2021 05/01/2021 05/01/2021 [id number] Suboxone Film INACTIVE 8 mg/1; 2 mg/1 28 14  0/0 CIII Medicaid   Cheryl [name]., Fnp-Bc [id number] [phone] [employer] [id number] 04/30/2021 04/30/2021 04/30/2021 [id number] Lorazepam INACTIVE 1 mg/1 19.500 12 0/0 CIV Medicaid   Celso Sickle [id number] [address] [place]. [id number] [phone] 04/20/2021 04/23/2021 04/23/2021 [id number] Suboxone INACTIVE 12 mg/1, 3 mg/1  7 7 0/0 CIII Medicaid   Celso Sickle [id number] [address] [place]. [id number] [phone] 04/10/2021 04/10/2021 04/10/2021 [id number] Suboxone INACTIVE 12 mg/1, 3 mg/1 14 14  0/0 CIII Medicaid   Celso Sickle [id number]  [place]. [id number] [phone] 04/04/2021 04/04/2021 04/04/2021 [id number] Suboxone Film INACTIVE 8 mg/1; 2 mg/1 10 10  0/0 CIII Medicaid   Celso Sickle [id number] [address] [employer] [id number] 03/29/2021 03/29/2021 03/29/2021 [id number] Suboxone Film INACTIVE 8 mg/1; 2 mg/1 7 7  0/0 CIII Medicaid                      Cheryl Overdose in the Past 12 Months - As reported by [facility] medical service providers (EMS, ER, etc.)   Medical Service Provider Location Date of Incident   "If data appears in the text box immediately above, information has been reported regarding an overdose incident which may or may not relate to this patient. Please check further. If it is inaccurate, please report to [email or web address]"

## 2021-11-27 NOTE — Nursing Note (Signed)
11/27/21 1247   Drug Screen Results   Amphetamine (AMP) Negative   Barbiturates (BAR) Negative   BUP - Cut off Levels 10 ng/ml Positive   Benzodiazepine (BZO) Negative   Cocaine (COC) Negative   Methamphetamine (MET) Negative   Methadone (MTD) Negative   Morphine (MOP) Negative   Oxycodone (OXY) Negative   Marijuana (THC) Negative   Ecstasy (MDMA) Negative   Phencyclidine (PCP) Negative   Tricyclic Antidepressant (TCA) Negative   Temperature within range? yes   Observed no   Tester AK   Physician Lilymae Swiech   Lot # [id number]   Expiration Date 04/27/23   Internal Control Valid yes   Initials ak     Days Sober Reported PRIOR to being asked for urine drug screen:  83  on 11/27/2021 with Relapse on           Other concerns to be notes: n/a    Patient sent out for ETGR, GABA , BUP, and FENT     Freddrick March, Ambulatory Care Assistant  11/27/2021, 12:48        William Hamburger, MD

## 2021-11-29 ENCOUNTER — Other Ambulatory Visit: Payer: Self-pay

## 2021-11-29 ENCOUNTER — Telehealth: Payer: MEDICAID | Admitting: Psychiatry

## 2021-11-29 ENCOUNTER — Telehealth: Payer: MEDICAID | Admitting: Social Worker

## 2021-11-29 DIAGNOSIS — F1120 Opioid dependence, uncomplicated: Secondary | ICD-10-CM

## 2021-11-29 LAB — FENTANYL CONFIRMATORY/DEFINITIVE, URINE, BY LC-MS/MS (PERFORMABLE)
FENTANYL INTERPRETATION: NEGATIVE
FENTANYL: NOT DETECTED ng/mL (ref <0.5)
NORFENTANYL: NOT DETECTED ng/mL (ref <2)

## 2021-11-29 MED ORDER — BUPRENORPHINE 8 MG-NALOXONE 2 MG SUBLINGUAL FILM
1.0000 | ORAL_FILM | Freq: Two times a day (BID) | SUBLINGUAL | 0 refills | Status: DC
Start: 2021-11-29 — End: 2021-12-06

## 2021-11-29 NOTE — Progress Notes (Signed)
COAT TELE-Clinic Progress Note  TELEMEDICINE DOCUMENTATION:    Patient Location:  [address]  [place] [address]   Patient/family aware of provider location:  YES  Patient/family consent for telemedicine:  YES    Due to COVID-19 concerns patient presents for virtual follow-up to the COAT Clinic. The group modality was facilitated via Zoom.     The patient was seen as part of a collaborative telemedicine service with their COAT treatment team (case mgr, therapist, prescriber) who participated in the encounter by active presence via approved video/audio means for portions of the encounter.  This clinic visit included medication management, addiction education, and evaluation of progress in recovery.     This visit included extensive discussion regarding proper etiquette for use of tele in a group based format.    Examination observed and performed by:  Allayne Gitelman. Allyson Sabal, DO COAT Clinic Tele-Video-Audio Progress Note  Patient Name:  Cheryl [name]  MRN: [id number]  Date: 11/29/2021   Chief Complaint: "Addiction"  Current MAT: Buprenorphine   16 mg  Subjective:    Patient has been sober for 84 days.   Had attempted to move to [facility] but the living situation was not conducive to her recovery so she moved back to [place] with a friend. Looking for a new apartment.   Cravings-None   Withdrawal Symptoms- None  Side Effects-None    Motivation is good    General Exam:  Patient is in no acute distress  alert and oriented      Mental Status:  Speech is clear and coherent.  Thought process is linear.  No indication of responding to internal stimuli.  Affect is stable  Attention is sustained  Concentration is sustained  Insight is good    Assessment:  Opioid Dependence 304.9 - Stable        Plan:  Continue COAT Clinic and medication.  [address]  Coordination of Care:  Coordination of Care:  Patient was seen in the COAT Clinic in the Department of Behavioral Medicine and Psychiatry at [facility] in [place], [place].  This clinic visit included medication management, addiction education, and evaluation of progress in recovery in a group setting. Patient's progress, psychosocial functioning and treatment response was discussed during the treatment team meeting with the case manager and/or therapist to assist in medical decision-making.    [name], DO 11/29/2021

## 2021-11-30 LAB — ETHYL GLUCURONIDE AND ETHYL SULFATE, URINE
ETHYL GLUCURONIDE: NEGATIVE
ETHYL SULFATE: NEGATIVE

## 2021-11-30 LAB — ETHYL GLUCURONIDE & ETHYL SULFATE (ALCOHOL METABOLITES),URINE
CREATININE RANDOM URINE: 84 mg/dL (ref 50–100)
ETHYL GLUCURONIDE: NEGATIVE
ETHYL SULFATE: NEGATIVE

## 2021-12-02 LAB — NOTES AND COMMENTS

## 2021-12-02 LAB — DRUG MONITORING, GABAPENTIN, QUANTITATIVE, URINE: GABAPENTIN: NEGATIVE ng/mL (ref <1000)

## 2021-12-03 NOTE — Progress Notes (Signed)
[facility] Medicine  Department of Behavioral Medicine and Psychiatry  [facility]  Comprehensive Opioid Addiction Treatment (COAT) Program    COAT GROUP THERAPY PROGRESS NOTE  NAME: Cheryl [name]  MRN: [id number]  Date of Service: 11/29/2021  TIME: 4:30pm - 5:20pm (50 minutes)  GROUP MEMBERS PRESENT: 8 Members   CPT CODE: 54098  CHIEF CONCERN: Group Therapy    TELEMEDICINE DOCUMENTATION:    Patient Location:  MyChart video visit from home address: [address]  [place] [address]    Patient/family aware of provider location:  yes  Patient/family consent for telemedicine:  yes  Examination observed and performed by:  Corena Herter, LGSW    Subjective: Cheryl [name] was seen for group therapy, as part of the Comprehensive Opioid Addiction Treatment (COAT) Clinic. She reports having 75 days sober, and reported attending any peer-led support meetings with psychiatrist. Group was held over Zoom. Patients were educated on how to help protect their privacy as well as the privacy of other members within the group. Discussion was facilitated in an open format, as well as by individual check-in.     Topics discussed in group included providing group members space to process their week and gain support needed, naming one goal they had for the week to support their overall well-being, and checking in on where they are in life. Today, members shared the things that helped them in early recovery, including changing person, places and things. Her goal is to watch her calories and go for walks. She reports being grateful for still being alive and having a place to stay.      Observation:  Mood: euthymic  Affect: Congruent to mood  Thought processes: Cogent and eye contact is good  Appearance: Casually dressed   Behavior: Active participant     Assessment: (Dx) Opioid use disorder, on maintenance therapy    Procedure: Patient attended the weekly process and psychoeducational COAT Group, designed to be appropriate  for individuals with opioid dependence in early recovery. The focus of this group to educate about the process of  building a sound recovery program, to facilitate the understanding of the disease of addiction and how Medication Assisted Treatment (MAT) works to aid in the development of recovery, and to support the practice of new recovery tools. Encouragement of attendance at 12 step recovery meetings, identification of triggers for use/relapse and the development of a supportive social network were also covered. The following therapeutic techniques were utilized: CBT, mindfulness, motivational interviewing, feeling/emotion validation.    Plan: Abstinence, take medication as prescribed, and return to clinic for next scheduled appointment.    [address]" Randa Evens, MSW, LGSW  Associate Clinical Therapist  [facility] Department of Behavioral Medicine and Psychiatry  12/03/2021 11:37      I have reviewed the note and agree with procedure and plan as documented.     Carollee Herter, PhD, LICSW  Assistant Professor, Clinical Therapist  Department of Behavioral Medicine and Psychiatry

## 2021-12-05 LAB — SUBOXONE CONFIRMATORY/DEFINITIVE, URINE, BY LC-MS/MS (PERFORMABLE)
BUPRENORPHINE: 64 ng/mL — ABNORMAL HIGH (ref <10)
NALOXONE: 171 ng/mL — ABNORMAL HIGH (ref <5)
NORBUPRENORPHINE: 443 ng/mL — ABNORMAL HIGH (ref <10)

## 2021-12-06 ENCOUNTER — Other Ambulatory Visit: Payer: Self-pay

## 2021-12-06 ENCOUNTER — Telehealth: Payer: MEDICAID | Admitting: Social Worker

## 2021-12-06 ENCOUNTER — Telehealth: Payer: MEDICAID | Admitting: Psychiatry

## 2021-12-06 DIAGNOSIS — F1120 Opioid dependence, uncomplicated: Secondary | ICD-10-CM

## 2021-12-06 DIAGNOSIS — Z79899 Other long term (current) drug therapy: Secondary | ICD-10-CM

## 2021-12-06 MED ORDER — BUPRENORPHINE 8 MG-NALOXONE 2 MG SUBLINGUAL FILM
1.0000 | ORAL_FILM | Freq: Two times a day (BID) | SUBLINGUAL | 0 refills | Status: DC
Start: 2021-12-06 — End: 2021-12-13

## 2021-12-06 NOTE — Progress Notes (Signed)
COAT TELE-Clinic Progress Note  TELEMEDICINE DOCUMENTATION:    Patient Location:  [address]  [place] [address]   Patient/family aware of provider location:  YES  Patient/family consent for telemedicine:  YES    Due to COVID-19 concerns patient presents for virtual follow-up to the COAT Clinic. The group modality was facilitated via Zoom.     The patient was seen as part of a collaborative telemedicine service with their COAT treatment team (case mgr, therapist, prescriber) who participated in the encounter by active presence via approved video/audio means for portions of the encounter.  This clinic visit included medication management, addiction education, and evaluation of progress in recovery.     This visit included extensive discussion regarding proper etiquette for use of tele in a group based format.    Examination observed and performed by:  Allayne Gitelman. Allyson Sabal, DO COAT Clinic Tele-Video-Audio Progress Note  Patient Name:  Cheryl [name]  MRN: [id number]  Date: 12/06/2021   Chief Complaint: "Addiction"  Current MAT: Buprenorphine   16 mg  Subjective:    Patient has been sober for 82 days.   "I've been great!" Found a job at the [employer]. Exercising and eating well. Making use of church. Looking for CR meetings.   Cravings-None   Withdrawal Symptoms- None  Side Effects-None    Motivation is good    General Exam:  Patient is in no acute distress  alert and oriented      Mental Status:  Speech is clear and coherent.  Thought process is linear.  No indication of responding to internal stimuli.  Affect is stable  Attention is sustained  Concentration is sustained  Insight is good    Assessment:  Opioid Dependence 304.9 - Stable        Plan:  Continue COAT Clinic and medication.  [address]  Coordination of Care:  Coordination of Care:  Patient was seen in the COAT Clinic in the Department of Behavioral Medicine and Psychiatry at [facility] in [place], [place]. This clinic visit included medication management,  addiction education, and evaluation of progress in recovery in a group setting. Patient's progress, psychosocial functioning and treatment response was discussed during the treatment team meeting with the case manager and/or therapist to assist in medical decision-making.    [name], DO 12/06/2021

## 2021-12-06 NOTE — Progress Notes (Signed)
[facility] Medicine  Department of Behavioral Medicine and Psychiatry  [facility]  Comprehensive Opioid Addiction Treatment (COAT) Program    COAT GROUP THERAPY PROGRESS NOTE  NAME: Cheryl [name]  MRN: [id number]  Date of Service: 12/06/2021  TIME: 4:20pm - 5:15pm (55 minutes)  GROUP MEMBERS PRESENT: 6 Members   CPT CODE: 38882  CHIEF CONCERN: Group Therapy    TELEMEDICINE DOCUMENTATION:    Patient Location:  MyChart video visit from home address: [address]  [place] [address]    Patient/family aware of provider location:  yes  Patient/family consent for telemedicine:  yes  Examination observed and performed by:  Corena Herter, LGSW    Subjective: Cheryl [name] was seen for group therapy, as part of the Comprehensive Opioid Addiction Treatment (COAT) Clinic. She reports having 82 days sober, and reported attending any peer-led support meetings with psychiatrist. Group was held over Zoom. Patients were educated on how to help protect their privacy as well as the privacy of other members within the group. Discussion was facilitated in an open format, as well as by individual check-in.     Topics discussed in group included providing group members space to process their week and gain support needed, naming one goal they had for the week to support their overall well-being, and checking in on where they are in life. Today, members shared about their struggles with parenting and bullying and bonded over discussion of television shows and books. Her goal is to walk more for her recovery. She reports being grateful for starting a new job soon.      Observation:  Mood: euthymic  Affect: Congruent to mood  Thought processes: Cogent and eye contact is good  Appearance: Casually dressed   Behavior: Active participant     Assessment: (Dx) Opioid use disorder, on maintenance therapy    Procedure: Patient attended the weekly process and psychoeducational COAT Group, designed to be appropriate for individuals  with opioid dependence in early recovery. The focus of this group to educate about the process of  building a sound recovery program, to facilitate the understanding of the disease of addiction and how Medication Assisted Treatment (MAT) works to aid in the development of recovery, and to support the practice of new recovery tools. Encouragement of attendance at 12 step recovery meetings, identification of triggers for use/relapse and the development of a supportive social network were also covered. The following therapeutic techniques were utilized: CBT, mindfulness, motivational interviewing, feeling/emotion validation.    Plan: Abstinence, take medication as prescribed, and return to clinic for next scheduled appointment.    [address] Randa Evens, MSW, LGSW  Associate Clinical Therapist  [facility] Department of Behavioral Medicine and Psychiatry  12/06/2021 17:29      I discussed the patient with the trainee/therapist and agree with the findings and plan of care as documented above.    Loel Dubonnet, LICSW, AADC  Associate Professor, Clinical Supervisor  Department of Behavioral Medicine and Psychiatry  [facility]

## 2021-12-07 ENCOUNTER — Encounter (HOSPITAL_COMMUNITY): Payer: Self-pay

## 2021-12-07 NOTE — Progress Notes (Signed)
[name] Controlled Substance Full Name Report Report Date 12/07/2021   From 09/06/2021 To 12/06/2021 Date of Birth March 29, 1986 Prescription Count 13   Last Name [name] First Name Cheryl Middle Name                                      Patients included in report that appear to match the search criteria.   Last Name First Name Middle Name Gender Address   Cheryl [name]   F [address] , [place], [place], [address]            Prescriber Name Prescriber DEA & Zip Dispenser Name Dispenser DEA & Zip Rx Written Date Rx Dispense Date  & Date Sold    Rx Number Product Name MEDD Status Strength Qty Days # of Refill Sched Payment Type   Cheryl [name] [address] [place], [address]   Cheryl [name] [id number] [address] [address]. [id number] [phone] 11/29/2021 11/29/2021 12/01/2021 [id number] Suboxone Film INACTIVE 8 mg/1; 2 mg/1 14 7  0/0 CIII Medicaid   Cheryl [name] [id number] [address] [employer] [id number] [phone] 11/23/2021 11/25/2021 11/27/2021 [id number] Suboxone Film INACTIVE 8 mg/1; 2 mg/1 6 3  0/0 CIII Medicaid   Cheryl [name] [id number] [address] [employer] [id number] [phone] 11/15/2021 11/18/2021 11/18/2021 [id number] Suboxone Film INACTIVE 8 mg/1; 2 mg/1 14 7  0/0 CIII Medicaid   Cheryl [name] [id number] [address] [employer] [id number] [phone] 11/08/2021 11/12/2021 11/12/2021 [id number] Suboxone Film INACTIVE 8 mg/1; 2 mg/1 14 7  0/0 CIII Medicaid   Cheryl Morgans, Fnp-Bc [id number] [phone] [place] [id number] [phone] 11/01/2021 11/04/2021 11/04/2021 [id number] Suboxone Film INACTIVE 8 mg/1; 2 mg/1 14 7  0/0 CIII Medicaid   Cheryl [name] [id number] [address] [employer] [id number] [phone] 10/25/2021 10/28/2021 10/28/2021 [id number] Suboxone Film INACTIVE 8 mg/1; 2 mg/1 14 7  0/0 CIII Medicaid   Cheryl [name] [id number]  [place]. [id number] [phone] 10/18/2021 10/22/2021 10/22/2021 [id number] Suboxone Film INACTIVE 8 mg/1; 2 mg/1 14 7  0/0 CIII Medicaid   Cheryl [name] [id number] [address] [employer] [id number] [phone] 10/11/2021 10/15/2021 10/15/2021 [id number] Suboxone Film INACTIVE 8  mg/1; 2 mg/1 14 7  0/0 CIII Medicaid   Cheryl [name] [id number] [address] [employer] [id number] [phone] 10/04/2021 10/08/2021 10/08/2021 [id number] Suboxone Film INACTIVE 8 mg/1; 2 mg/1 14 7  0/0 CIII Medicaid   Cheryl [name] [id number] [address] [employer] [id number] [phone] 09/27/2021 10/02/2021 10/02/2021 [id number] Suboxone Film INACTIVE 8 mg/1; 2 mg/1 14 7  0/0 CIII Medicaid   Cheryl [name] [id number] [address] [employer] [id number] [phone] 09/20/2021 09/25/2021 09/25/2021 [id number] Suboxone Film INACTIVE 8 mg/1; 2 mg/1 14 7  0/0 CIII Medicaid   Cheryl [name] [id number] [phone] [place] [id number] [phone] 09/13/2021 09/18/2021 09/18/2021 [id number] Suboxone Film INACTIVE 8 mg/1; 2 mg/1 14 7  0/0 CIII Medicaid   Cheryl [name] [id number] [address] [employer] [id number] [phone] 09/06/2021 09/11/2021 09/11/2021 [id number] Suboxone Film INACTIVE 8 mg/1; 2 mg/1 14 7  0/0 CIII Medicaid            Suspected Overdose Older Than 12 Months - As reported by [facility] medical service providers (EMS, ER, etc.)   Medical Service Provider Location Date of Incident   [facility]  &  ZIP Code: [address] June 02, 2020   "If data appears in the text box immediately above, information has been reported regarding a suspected overdose incident which may or may not relate to this patient. Please check  further. If it is inaccurate, please report to [email or web address]"              [name], CASE MANAGER

## 2021-12-13 ENCOUNTER — Telehealth: Payer: MEDICAID | Admitting: Psychiatry

## 2021-12-13 ENCOUNTER — Other Ambulatory Visit: Payer: Self-pay

## 2021-12-13 ENCOUNTER — Telehealth: Payer: MEDICAID | Admitting: Social Worker

## 2021-12-13 DIAGNOSIS — F1120 Opioid dependence, uncomplicated: Secondary | ICD-10-CM

## 2021-12-13 DIAGNOSIS — Z79899 Other long term (current) drug therapy: Secondary | ICD-10-CM

## 2021-12-13 MED ORDER — BUPRENORPHINE 8 MG-NALOXONE 2 MG SUBLINGUAL FILM
1.0000 | ORAL_FILM | Freq: Two times a day (BID) | SUBLINGUAL | 0 refills | Status: DC
Start: 2021-12-13 — End: 2021-12-20

## 2021-12-13 NOTE — Progress Notes (Signed)
COAT TELE-Clinic Progress Note  TELEMEDICINE DOCUMENTATION:    Patient Location:  [address]  [place] [address]   Patient/family aware of provider location:  YES  Patient/family consent for telemedicine:  YES    Due to COVID-19 concerns patient presents for virtual follow-up to the COAT Clinic. The group modality was facilitated via Zoom.     The patient was seen as part of a collaborative telemedicine service with their COAT treatment team (case mgr, therapist, prescriber) who participated in the encounter by active presence via approved video/audio means for portions of the encounter.  This clinic visit included medication management, addiction education, and evaluation of progress in recovery.     This visit included extensive discussion regarding proper etiquette for use of tele in a group based format.    Examination observed and performed by:  Allayne Gitelman. Allyson Sabal, DO COAT Clinic Tele-Video-Audio Progress Note  Patient Name:  Cheryl [name]  MRN: [id number]  Date: 12/13/2021   Chief Complaint: "Addiction"  Current MAT: Buprenorphine   16 mg  Subjective:    Patient has been sober for 89 days.   Doing well at new job. Has been very busy. Not as much time for meetings, etc.   Cravings-None   Withdrawal Symptoms- None  Side Effects-None    Motivation is good    General Exam:  Patient is in no acute distress  alert and oriented      Mental Status:  Speech is clear and coherent.  Thought process is linear.  No indication of responding to internal stimuli.  Affect is stable  Attention is sustained  Concentration is sustained  Insight is good    Assessment:  Opioid Dependence 304.9 - Stable        Plan:  Continue COAT Clinic and medication.  [id number]  Coordination of Care:  Coordination of Care:  Patient was seen in the COAT Clinic in the Department of Behavioral Medicine and Psychiatry at [facility] in [place], [place]. This clinic visit included medication management, addiction education, and evaluation of progress in  recovery in a group setting. Patient's progress, psychosocial functioning and treatment response was discussed during the treatment team meeting with the case manager and/or therapist to assist in medical decision-making.    [name], DO 12/13/2021

## 2021-12-13 NOTE — Progress Notes (Signed)
[facility] Medicine  Department of Behavioral Medicine and Psychiatry  [facility]  Comprehensive Opioid Addiction Treatment (COAT) Program    COAT GROUP THERAPY PROGRESS NOTE  NAME: Cheryl [name]  MRN: [id number]  Date of Service: 12/13/2021  TIME: 4:35pm - 5:25pm (50 minutes)  GROUP MEMBERS PRESENT: 9 Members   CPT CODE: 52841  CHIEF CONCERN: Group Therapy    TELEMEDICINE DOCUMENTATION:    Patient Location:  MyChart video visit from home address: [address]  [place] [address]    Patient/family aware of provider location:  yes  Patient/family consent for telemedicine:  yes  Examination observed and performed by:  Corena Herter, LGSW    Subjective: Mahdiya was seen for group therapy, as part of the Comprehensive Opioid Addiction Treatment (COAT) Clinic. She reports having 89 days sober, and reported attending any peer-led support meetings with psychiatrist. Group was held over Zoom. Patients were educated on how to help protect their privacy as well as the privacy of other members within the group. Discussion was facilitated in an open format, as well as by individual check-in.     Topics discussed in group included providing group members space to process their week and gain support needed, naming one goal they had for the week to support their overall well-being, and checking in on where they are in life. Her goal is to organize her things and start a new diet. She reports being grateful for sobriety.      Observation:  Mood: euthymic  Affect: Congruent to mood  Thought processes: Cogent and eye contact is good  Appearance: Casually dressed   Behavior: Active participant     Assessment: (Dx) Opioid use disorder, on maintenance therapy    Procedure: Patient attended the weekly process and psychoeducational COAT Group, designed to be appropriate for individuals with opioid dependence in early recovery. The focus of this group to educate about the process of  building a sound recovery  program, to facilitate the understanding of the disease of addiction and how Medication Assisted Treatment (MAT) works to aid in the development of recovery, and to support the practice of new recovery tools. Encouragement of attendance at 12 step recovery meetings, identification of triggers for use/relapse and the development of a supportive social network were also covered. The following therapeutic techniques were utilized: CBT, mindfulness, motivational interviewing, feeling/emotion validation.    Plan: Abstinence, take medication as prescribed, and return to clinic for next scheduled appointment.    [address]" Randa Evens, MSW, LGSW  Associate Clinical Therapist   Department of Behavioral Medicine and Psychiatry  12/13/2021 17:45      Loel Dubonnet, Mallie Snooks, AADC  Clinical Therapist, Supervisor  Associate Professor, Department of Behavioral Medicine and Psychiatry

## 2021-12-20 ENCOUNTER — Telehealth: Payer: MEDICAID | Admitting: Social Worker

## 2021-12-20 ENCOUNTER — Telehealth: Payer: MEDICAID | Admitting: Psychiatry

## 2021-12-20 ENCOUNTER — Other Ambulatory Visit: Payer: Self-pay

## 2021-12-20 DIAGNOSIS — F1120 Opioid dependence, uncomplicated: Secondary | ICD-10-CM

## 2021-12-20 DIAGNOSIS — Z79899 Other long term (current) drug therapy: Secondary | ICD-10-CM

## 2021-12-20 MED ORDER — BUPRENORPHINE 8 MG-NALOXONE 2 MG SUBLINGUAL FILM
1.0000 | ORAL_FILM | Freq: Two times a day (BID) | SUBLINGUAL | 0 refills | Status: DC
Start: 2021-12-20 — End: 2021-12-27

## 2021-12-20 NOTE — Progress Notes (Signed)
COAT TELE-Clinic Progress Note  TELEMEDICINE DOCUMENTATION:    Patient Location:  [address]  [place] [address]   Patient/family aware of provider location:  YES  Patient/family consent for telemedicine:  YES    Due to COVID-19 concerns patient presents for virtual follow-up to the COAT Clinic. The group modality was facilitated via Zoom.     The patient was seen as part of a collaborative telemedicine service with their COAT treatment team (case mgr, therapist, prescriber) who participated in the encounter by active presence via approved video/audio means for portions of the encounter.  This clinic visit included medication management, addiction education, and evaluation of progress in recovery.     This visit included extensive discussion regarding proper etiquette for use of tele in a group based format.    Examination observed and performed by:  Allayne Gitelman. Allyson Sabal, DO COAT Clinic Tele-Video-Audio Progress Note  Patient Name:  BRIANNIA [name]  MRN: [id number]  Date: 12/20/2021   Chief Complaint: "Addiction"  Current MAT: Buprenorphine   16 mg  Subjective:    Patient has been sober for 95 days.   Feeling good. Went to a couple different meetings. Got a name and number of a lady. Quit working at [employer] and now working at [employer] through [employer]. Has been in contact with [employer].   Cravings-None   Withdrawal Symptoms- None  Side Effects-None    Motivation is good    General Exam:  Patient is in no acute distress  alert and oriented      Mental Status:  Speech is clear and coherent.  Thought process is linear.  No indication of responding to internal stimuli.  Affect is stable  Attention is sustained  Concentration is sustained  Insight is good    Assessment:  Opioid Dependence 304.9 - Stable        Plan:  Continue COAT Clinic and medication.  [id number]  Coordination of Care:  Coordination of Care:  Patient was seen in the COAT Clinic in the Department of Behavioral Medicine and Psychiatry at [facility] in  [place], [place]. This clinic visit included medication management, addiction education, and evaluation of progress in recovery in a group setting. Patient's progress, psychosocial functioning and treatment response was discussed during the treatment team meeting with the case manager and/or therapist to assist in medical decision-making.    [name], DO 12/20/2021

## 2021-12-21 NOTE — Progress Notes (Signed)
[facility] Medicine  Department of Behavioral Medicine and Psychiatry  [facility]  Comprehensive Opioid Addiction Treatment (COAT) Program    COAT GROUP THERAPY PROGRESS NOTE  NAME: Cheryl [name]  MRN: [id number]  Date of Service: 12/20/2021  TIME: 4:35pm - 5:35pm (60 minutes)  GROUP MEMBERS PRESENT: 10 Members   CPT CODE: 70177  CHIEF CONCERN: Group Therapy    TELEMEDICINE DOCUMENTATION:    Patient Location:  MyChart video visit from home address: [address]  [place] [address]    Patient/family aware of provider location:  yes  Patient/family consent for telemedicine:  yes  Examination observed and performed by:  Corena Herter, LGSW    Subjective: Cheryl [name] was seen for group therapy, as part of the Comprehensive Opioid Addiction Treatment (COAT) Clinic. She reports having 95 days sober, and reported attending any peer-led support meetings with psychiatrist. Group was held over Zoom. Patients were educated on how to help protect their privacy as well as the privacy of other members within the group. Discussion was facilitated in an open format, as well as by individual check-in.     Topics discussed in group included providing group members space to process their week and gain support needed, naming one goal they had for the week to support their overall well-being, and checking in on where they are in life. Today, we had a new therapist sitting in and members shared what has been helpful for them in COAT therapy groups, as well as a guest speaker that shared her journey in recovery, as well as through COAT. Her goal is to eat healthier and stay away from sickness. She reports being grateful for being sober and clean.      Observation:  Mood: euthymic  Affect: Congruent to mood  Thought processes: Cogent and eye contact is good  Appearance: Casually dressed   Behavior: Active participant     Assessment: (Dx) Opioid use disorder, on maintenance therapy    Procedure: Patient attended the  weekly process and psychoeducational COAT Group, designed to be appropriate for individuals with opioid dependence in early recovery. The focus of this group to educate about the process of  building a sound recovery program, to facilitate the understanding of the disease of addiction and how Medication Assisted Treatment (MAT) works to aid in the development of recovery, and to support the practice of new recovery tools. Encouragement of attendance at 12 step recovery meetings, identification of triggers for use/relapse and the development of a supportive social network were also covered. The following therapeutic techniques were utilized: CBT, mindfulness, motivational interviewing, feeling/emotion validation.    Plan: Abstinence, take medication as prescribed, and return to clinic for next scheduled appointment.    [address] Randa Evens, MSW, LGSW  Associate Clinical Therapist  [facility] Department of Behavioral Medicine and Psychiatry  12/21/2021 00:26      I have reviewed the note and agree with procedure and plan as documented.     Carollee Herter, PhD, LICSW  Assistant Professor, Clinical Therapist  Department of Behavioral Medicine and Psychiatry

## 2021-12-27 ENCOUNTER — Telehealth: Payer: MEDICAID | Admitting: Psychiatry

## 2021-12-27 ENCOUNTER — Other Ambulatory Visit: Payer: Self-pay

## 2021-12-27 ENCOUNTER — Telehealth: Payer: MEDICAID | Admitting: Social Worker

## 2021-12-27 DIAGNOSIS — F1120 Opioid dependence, uncomplicated: Secondary | ICD-10-CM

## 2021-12-27 DIAGNOSIS — Z79899 Other long term (current) drug therapy: Secondary | ICD-10-CM

## 2021-12-27 MED ORDER — BUPRENORPHINE 8 MG-NALOXONE 2 MG SUBLINGUAL FILM
1.0000 | ORAL_FILM | Freq: Two times a day (BID) | SUBLINGUAL | 0 refills | Status: DC
Start: 2021-12-27 — End: 2022-01-03

## 2021-12-27 NOTE — Progress Notes (Signed)
COAT TELE-Clinic Progress Note  TELEMEDICINE DOCUMENTATION:    Patient Location:  [address]  [place] [address]   Patient/family aware of provider location:  YES  Patient/family consent for telemedicine:  YES    Due to COVID-19 concerns patient presents for virtual follow-up to the COAT Clinic. The group modality was facilitated via Zoom.     The patient was seen as part of a collaborative telemedicine service with their COAT treatment team (case mgr, therapist, prescriber) who participated in the encounter by active presence via approved video/audio means for portions of the encounter.  This clinic visit included medication management, addiction education, and evaluation of progress in recovery.     This visit included extensive discussion regarding proper etiquette for use of tele in a group based format.    Examination observed and performed by:  Nikki Dom. Gwenlyn Found, [facility] Tele-Video-Audio Progress Note  Patient Name:  Cheryl [name]  MRN: [id number]  Date: 12/27/2021   Chief Complaint: "Addiction"  Current MAT: Buprenorphine   16 mg  Subjective:    Patient has been sober for 103 days.   "Doing great". Has put in a number of job applications. Going to a few different meetings. Will start [facility] training next Weds.   Cravings-None   Withdrawal Symptoms- None  Side Effects-None    Motivation is good    General Exam:  Patient is in no acute distress  alert and oriented      Mental Status:  Speech is clear and coherent.  Thought process is linear.  No indication of responding to internal stimuli.  Affect is stable  Attention is sustained  Concentration is sustained  Insight is good    Assessment:  Opioid Dependence 304.9 - Stable        Plan:  Continue COAT Clinic and medication.  [address]  Coordination of Care:  Coordination of Care:  Patient was seen in the [facility] in the Department of Behavioral Medicine and Psychiatry at [facility] in [place], [place]. This clinic visit included medication management,  addiction education, and evaluation of progress in recovery in a group setting. Patient's progress, psychosocial functioning and treatment response was discussed during the treatment team meeting with the case manager and/or therapist to assist in medical decision-making.    Luetta Nutting, DO 12/27/2021

## 2021-12-27 NOTE — Progress Notes (Signed)
[facility] Medicine  Department of Behavioral Medicine and Psychiatry  [facility]  Comprehensive Opioid Addiction Treatment (COAT) Program    COAT GROUP THERAPY PROGRESS NOTE  NAME: Cheryl [name]  MRN: [id number]  Date of Service: 12/27/2021  TIME: 4:35pm - 5:30pm (55 minutes)  GROUP MEMBERS PRESENT: 8 Members   CPT CODE: 63846  CHIEF CONCERN: Group Therapy    TELEMEDICINE DOCUMENTATION:    Patient Location:  MyChart video visit from home address: [address]  [place] [address]    Patient/family aware of provider location:  yes  Patient/family consent for telemedicine:  yes  Examination observed and performed by:  Sherry Ruffing, LGSW    Subjective: Cheryl [name] was seen for group therapy, as part of the Comprehensive Opioid Addiction Treatment (COAT) Clinic. She reports having 103 days sober, and reported attending any peer-led support meetings with psychiatrist. Group was held over [place]. Patients were educated on how to help protect their privacy as well as the privacy of other members within the group. Discussion was facilitated in an open format, as well as by individual check-in.     Topics discussed in group included providing group members space to process their week and gain support needed, naming one goal they had for the week to support their overall well-being, and checking in on where they are in life. Her goal is to keep looking for a job, as she is feeling a bit depressed without one. She reports being grateful to be sober.      Observation:  Mood: depressed  Affect: Congruent to mood  Thought processes: Cogent and eye contact is good  Appearance: Casually dressed   Behavior: Active participant     Assessment: (Dx) Opioid use disorder, on maintenance therapy    Procedure: Patient attended the weekly process and psychoeducational COAT Group, designed to be appropriate for individuals with opioid dependence in early recovery. The focus of this group to educate about the process of   building a sound recovery program, to facilitate the understanding of the disease of addiction and how Medication Assisted Treatment (MAT) works to aid in the development of recovery, and to support the practice of new recovery tools. Encouragement of attendance at 12 step recovery meetings, identification of triggers for use/relapse and the development of a supportive social network were also covered. The following therapeutic techniques were utilized: CBT, mindfulness, motivational interviewing, feeling/emotion validation.    Plan: Abstinence, take medication as prescribed, and return to clinic for next scheduled appointment.    [address]" Oletta Lamas, MSW, [place]  Associate Clinical Therapist  [place] Department of Behavioral Medicine and Psychiatry  12/27/2021 17:43      I have reviewed the note and agree with procedure and plan as documented.     Tanja Port, PhD, LICSW  Assistant Professor, Clinical Therapist  Department of Behavioral Medicine and Psychiatry

## 2021-12-28 ENCOUNTER — Other Ambulatory Visit: Payer: Self-pay

## 2021-12-28 ENCOUNTER — Ambulatory Visit (INDEPENDENT_AMBULATORY_CARE_PROVIDER_SITE_OTHER): Payer: MEDICAID

## 2021-12-28 ENCOUNTER — Other Ambulatory Visit: Payer: MEDICAID | Attending: Psychiatry | Admitting: Psychiatry

## 2021-12-28 DIAGNOSIS — F1120 Opioid dependence, uncomplicated: Secondary | ICD-10-CM

## 2021-12-28 LAB — CREATININE URINE, RANDOM: CREATININE RANDOM URINE: 210 mg/dL — ABNORMAL HIGH (ref 50–100)

## 2021-12-28 NOTE — Nursing Note (Cosign Needed Addendum)
12/28/21 1300   Drug Screen Results   Amphetamine (AMP) Negative   Barbiturates (BAR) Negative   BUP - Cut off Levels 10 ng/ml Positive   Benzodiazepine (BZO) Negative   Cocaine (COC) Negative   Methamphetamine (MET) Negative   Methadone (MTD) Negative   Morphine (MOP) Negative   Oxycodone (OXY) Negative   Propoxyphine (PPX) Negative   Marijuana (THC) Negative   Ecstasy (MDMA) Negative   Phencyclidine (PCP) Negative   Tricyclic Antidepressant (TCA) Negative   Temperature within range? yes   Observed no   Tester [name]   Physician Gwenlyn Found   Lot # [id number]   Expiration Date 04/20/2023   Internal Control Valid yes   Initials CND     Days Sober Reported PRIOR to being asked for urine drug screen:  104  on 12/28/2021 with No Relapse       If relapse, on what?      Other concerns to be notes:     Patient sent out for ETGR, GABA , BUP, and FENT     Cheryl Masin, MA  12/28/2021, 13:14

## 2021-12-31 LAB — FENTANYL CONFIRMATORY/DEFINITIVE, URINE, BY LC-MS/MS (PERFORMABLE)
FENTANYL INTERPRETATION: NEGATIVE
FENTANYL: NOT DETECTED ng/mL (ref <0.5)
NORFENTANYL: NOT DETECTED ng/mL (ref <2)

## 2022-01-01 LAB — ETHYL GLUCURONIDE & ETHYL SULFATE (ALCOHOL METABOLITES),URINE
CREATININE RANDOM URINE: 210 mg/dL — ABNORMAL HIGH (ref 50–100)
ETHYL GLUCURONIDE: NEGATIVE
ETHYL SULFATE: NEGATIVE

## 2022-01-01 LAB — SUBOXONE CONFIRMATORY/DEFINITIVE, URINE, BY LC-MS/MS (PERFORMABLE)
BUPRENORPHINE: 752 ng/mL — ABNORMAL HIGH (ref <10)
NALOXONE: >1000 ng/mL — ABNORMAL HIGH (ref <5)
NORBUPRENORPHINE: >1000 ng/mL — ABNORMAL HIGH (ref <10)

## 2022-01-01 LAB — ETHYL GLUCURONIDE AND ETHYL SULFATE, URINE
ETHYL GLUCURONIDE: NEGATIVE
ETHYL SULFATE: NEGATIVE

## 2022-01-02 LAB — DRUG MONITORING, GABAPENTIN, QUANTITATIVE, URINE: GABAPENTIN: NEGATIVE ng/mL (ref <1000)

## 2022-01-02 LAB — NOTES AND COMMENTS

## 2022-01-03 ENCOUNTER — Telehealth: Payer: MEDICAID | Admitting: Psychiatry

## 2022-01-03 ENCOUNTER — Telehealth: Payer: MEDICAID | Admitting: Social Worker

## 2022-01-03 ENCOUNTER — Other Ambulatory Visit: Payer: Self-pay

## 2022-01-03 DIAGNOSIS — F1120 Opioid dependence, uncomplicated: Secondary | ICD-10-CM

## 2022-01-03 DIAGNOSIS — Z79899 Other long term (current) drug therapy: Secondary | ICD-10-CM

## 2022-01-03 MED ORDER — BUPRENORPHINE 8 MG-NALOXONE 2 MG SUBLINGUAL FILM
1.0000 | ORAL_FILM | Freq: Two times a day (BID) | SUBLINGUAL | 0 refills | Status: DC
Start: 2022-01-03 — End: 2022-01-17

## 2022-01-03 MED ORDER — BUPRENORPHINE 8 MG-NALOXONE 2 MG SUBLINGUAL FILM
1.0000 | ORAL_FILM | Freq: Two times a day (BID) | SUBLINGUAL | 0 refills | Status: DC
Start: 2022-01-03 — End: 2022-01-03

## 2022-01-03 NOTE — Progress Notes (Signed)
COAT TELE-Clinic Progress Note  TELEMEDICINE DOCUMENTATION:    Patient Location:  [address]  [place] [address]   Patient/family aware of provider location:  YES  Patient/family consent for telemedicine:  YES    Due to COVID-19 concerns patient presents for virtual follow-up to the COAT Clinic. The group modality was facilitated via Zoom.     The patient was seen as part of a collaborative telemedicine service with their COAT treatment team (case mgr, therapist, prescriber) who participated in the encounter by active presence via approved video/audio means for portions of the encounter.  This clinic visit included medication management, addiction education, and evaluation of progress in recovery.     This visit included extensive discussion regarding proper etiquette for use of tele in a group based format.    Examination observed and performed by:  Nikki Dom. Gwenlyn Found, [facility] Tele-Video-Audio Progress Note  Patient Name:  Cheryl [name]  MRN: [id number]  Date: 01/03/2022   Chief Complaint: "Addiction"  Current MAT: Buprenorphine   16 mg  Subjective:    Patient has been sober for 110 days.   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 Very excited. Loves learning about the disease of addiction. 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 Ready to graduate.   Cravings-None   Withdrawal Symptoms- None  Side Effects-None    Motivation is good    General Exam:  Patient is in no acute distress  alert and oriented      Mental Status:  Speech is clear and coherent.  Thought process is linear.  No indication of responding to internal stimuli.  Affect is stable  Attention is sustained  Concentration is sustained  Insight is good    Assessment:  Opioid Dependence 304.9 - Stable        Plan:  Continue COAT Clinic and medication.  [id number]  Coordination of Care:  Coordination of Care:  Patient was seen in the [facility] in the Department of Behavioral Medicine and Psychiatry at [facility] in [place], [place]. This clinic visit  included medication management, addiction education, and evaluation of progress in recovery in a group setting. Patient's progress, psychosocial functioning and treatment response was discussed during the treatment team meeting with the case manager and/or therapist to assist in medical decision-making.    Luetta Nutting, DO 01/03/2022

## 2022-01-04 ENCOUNTER — Ambulatory Visit (HOSPITAL_COMMUNITY): Payer: Self-pay

## 2022-01-04 NOTE — Telephone Encounter (Addendum)
Called pharmacy to confirm correct script.    Cheryl [name], CT          ----- Message from Charlett Blake sent at 01/03/2022  4:30 PM EDT -----  Pharmacy called and said they received two scripts today and two different quantities. Please call to let them know which one to fill. Thank you, Tonya         Current Outpatient Medications:  buprenorphine-naloxone (SUBOXONE) 8-2 mg Sublingual Film, Place 1 Film under the tongue Twice daily Indications: prevention of relapse to opioid dependence          Preferred [place], [place]    [place] [address]    Phone: [phone] Fax: [phone]    Hours: Not open 24 hours

## 2022-01-07 NOTE — Progress Notes (Signed)
[facility] Medicine  Department of Behavioral Medicine and Psychiatry  [facility]  Comprehensive Opioid Addiction Treatment (COAT) Program    COAT GROUP THERAPY PROGRESS NOTE  NAME: Cheryl [name]  MRN: [id number]  Date of Service: 01/03/2022  TIME: 4:35pm - 5:30pm (55 minutes)  GROUP MEMBERS PRESENT: 7 Members   CPT CODE: 50932  CHIEF CONCERN: Group Therapy    TELEMEDICINE DOCUMENTATION:    Patient Location:  MyChart video visit from home address: [address]  [place] [address]    Patient/family aware of provider location:  yes  Patient/family consent for telemedicine:  yes  Examination observed and performed by:  Sherry Ruffing, LGSW    Subjective: Cheryl [name] was seen for group therapy, as part of the Comprehensive Opioid Addiction Treatment (COAT) Clinic. She reports having 110 days sober, and reported attending any peer-led support meetings with psychiatrist. Group was held over [place]. Patients were educated on how to help protect their privacy as well as the privacy of other members within the group. Discussion was facilitated in an open format, as well as by individual check-in.     Topics discussed in group included providing group members space to process their week and gain support needed, naming one goal they had for the week to support their overall well-being, and checking in on where they are in life. We also sent off a graduating member with well-wishes and welcomed a new member. Her goal is to watch some more TEDTalks. She reports being grateful for her children and sobriety.      Observation:  Mood: euthymic  Affect: Congruent to mood  Thought processes: Cogent and eye contact is good  Appearance: Casually dressed   Behavior: Active participant     Assessment: (Dx) Opioid use disorder, on maintenance therapy    Procedure: Patient attended the weekly process and psychoeducational COAT Group, designed to be appropriate for individuals with opioid dependence in early recovery. The  focus of this group to educate about the process of  building a sound recovery program, to facilitate the understanding of the disease of addiction and how Medication Assisted Treatment (MAT) works to aid in the development of recovery, and to support the practice of new recovery tools. Encouragement of attendance at 12 step recovery meetings, identification of triggers for use/relapse and the development of a supportive social network were also covered. The following therapeutic techniques were utilized: CBT, mindfulness, motivational interviewing, feeling/emotion validation.    Plan: Abstinence, take medication as prescribed, and return to clinic for next scheduled appointment.    [address] Oletta Lamas, MSW, [place]  Associate Clinical Therapist  [place] Department of Behavioral Medicine and Psychiatry  01/07/2022 11:19      I have reviewed the note and agree with procedure and plan as documented.     Tanja Port, PhD, LICSW  Assistant Professor, Clinical Therapist  Department of Behavioral Medicine and Psychiatry

## 2022-01-17 ENCOUNTER — Telehealth: Payer: MEDICAID | Admitting: Psychiatry

## 2022-01-17 ENCOUNTER — Telehealth: Payer: MEDICAID | Admitting: Clinical

## 2022-01-17 ENCOUNTER — Other Ambulatory Visit: Payer: Self-pay

## 2022-01-17 DIAGNOSIS — Z79899 Other long term (current) drug therapy: Secondary | ICD-10-CM

## 2022-01-17 DIAGNOSIS — F1120 Opioid dependence, uncomplicated: Secondary | ICD-10-CM

## 2022-01-17 MED ORDER — BUPRENORPHINE 8 MG-NALOXONE 2 MG SUBLINGUAL FILM
1.0000 | ORAL_FILM | Freq: Two times a day (BID) | SUBLINGUAL | 0 refills | Status: DC
Start: 2022-01-17 — End: 2022-01-31

## 2022-01-17 NOTE — Progress Notes (Signed)
COAT GROUP BI-WEEKLY PROGRESS NOTE    GROUP THERAPY    NAME: Cheryl [name]  Date of Service: 01/17/2022  TIME IN: 2:30  TIME OUT: 3:30  CPT CODE: 38453  CHIEF COMPLAINT: Group Therapy for Addiction  NUMBER IN GROUP: 7    Subjective: Patient seen for group therapy.  Patient has >90 days without the use of substances and has attended  required meetings.     Provided psycho education to group member on identifying high risk situations for relapse and looking at times when members were confronted by these examples. Explored when they were successful in avoiding relapse and what coping skills were utilized. Discussed times when relapse occurred and what skills could have been implemented.     Observation:  Mood: within normal limits  Affect: congruent to mood and normal range   Participation: Active participant        Assessment/Diagnosis: Opioid Use Disorder, with dependence, on maintenance therapy (F11.20)    Procedure: Patient attended the intermediate biweekly COAT Group. The focus of this group to educate about the process of  building a sound recovery program, to facilitate the understanding of the disease of addiction and how Suboxone works to aid in the development of recovery, and to support the practice of new recovery tools. Encouragement of attendance at 12 step recovery meetings, identification of triggers for use/ relapse and the development of a supportive social network were also covered.    Plan: Abstinence, take suboxone as prescribed, attend at least 4 NA/AA meetings a week and return to clinic in two weeks.    Charolett Bumpers, Mallie Snooks, PhD  Associate Professor  Department of Behavioral Medicine and Psychiatry

## 2022-01-17 NOTE — Progress Notes (Signed)
COAT TELE-Clinic Progress Note  TELEMEDICINE DOCUMENTATION:    Patient Location:  [address]  [place] [address]   Patient/family aware of provider location:  YES  Patient/family consent for telemedicine:  YES    Due to COVID-19 concerns patient presents for virtual follow-up to the COAT Clinic. The group modality was facilitated via Zoom.     The patient was seen as part of a collaborative telemedicine service with their COAT treatment team (case mgr, therapist, prescriber) who participated in the encounter by active presence via approved video/audio means for portions of the encounter.  This clinic visit included medication management, addiction education, and evaluation of progress in recovery.     This visit included extensive discussion regarding proper etiquette for use of tele in a group based format.    Examination observed and performed by:  Nikki Dom. Gwenlyn Found, [facility] Tele-Video-Audio Progress Note  Patient Name:  Cheryl [name]  MRN: [id number]  Date: 01/17/2022   Chief Complaint: "Addiction"  Current MAT: Buprenorphine   16 mg  Subjective:    Patient has been sober for 124 days.   Doing very well. Enjoyed hearing presentations about addiction. Started peer recovery training.   Cravings-None   Withdrawal Symptoms- None  Side Effects-None    Motivation is good    General Exam:  Patient is in no acute distress  alert and oriented      Mental Status:  Speech is clear and coherent.  Thought process is linear.  No indication of responding to internal stimuli.  Affect is stable  Attention is sustained  Concentration is sustained  Insight is good    Assessment:  Opioid Dependence 304.9 - Stable        Plan:  Continue COAT Clinic and medication.  [address]  Coordination of Care:  Coordination of Care:  Patient was seen in the [facility] in the Department of Behavioral Medicine and Psychiatry at [facility] in [place], [place]. This clinic visit included medication management, addiction education, and  evaluation of progress in recovery in a group setting. Patient's progress, psychosocial functioning and treatment response was discussed during the treatment team meeting with the case manager and/or therapist to assist in medical decision-making.    Luetta Nutting, DO 01/17/2022

## 2022-01-25 ENCOUNTER — Encounter (HOSPITAL_COMMUNITY): Payer: Self-pay | Admitting: Emergency Medicine

## 2022-01-31 ENCOUNTER — Telehealth: Payer: MEDICAID | Admitting: PSYCHIATRY

## 2022-01-31 ENCOUNTER — Ambulatory Visit (HOSPITAL_COMMUNITY): Payer: MEDICAID | Admitting: Clinical

## 2022-01-31 ENCOUNTER — Other Ambulatory Visit: Payer: Self-pay

## 2022-01-31 DIAGNOSIS — Z79899 Other long term (current) drug therapy: Secondary | ICD-10-CM

## 2022-01-31 DIAGNOSIS — F1120 Opioid dependence, uncomplicated: Secondary | ICD-10-CM

## 2022-01-31 MED ORDER — BUPRENORPHINE 8 MG-NALOXONE 2 MG SUBLINGUAL FILM
1.0000 | ORAL_FILM | Freq: Two times a day (BID) | SUBLINGUAL | 0 refills | Status: DC
Start: 2022-01-31 — End: 2022-02-14

## 2022-01-31 NOTE — Progress Notes (Signed)
COAT Clinic Progress Note  Patient Name:  Cheryl [name]  MRN: [id number]  Date: 01/31/2022  CC: Follow-up med check.  SUBJECTIVE:  Due to COVID-19 concerns patient presents for virtual follow-up to the COAT Clinic. The group modality was facilitated via Zoom.     The patient was seen as part of a collaborative telemedicine service with their COAT treatment team (case mgr, therapist, prescriber) who participated in the encounter by active presence via approved video/audio means for portions of the encounter.  This clinic visit included medication management, addiction education, and evaluation of progress in recovery.     This visit included extensive discussion regarding proper etiquette for use of tele in a group based format.    TELEMEDICINE DOCUMENTATION:    Patient Location:  Zoom group med mgt session from [address]  [place] [address]   Patient/family aware of provider location: Yes  Patient/family consent for telemedicine:  Yes  Examination observed and performed by:  Trevor Iha, MD    Patient's progress, psychosocial functioning and treatment response was discussed during the treatment team meeting with the therapist and case manager to assist in medical decision-making.    Risks and benefits of MAT/bup/nlx for opioid use disorder are routinely discussed.     Patient is tolerating medication well without side-effects.     Denies relapse. No complaints.    ROS: - constipation, - depression/anxiety, - myalgia, - diaphoresis, - insomnia, all others negative    Medications:  Current Outpatient Medications   Medication Sig    buprenorphine-naloxone (SUBOXONE) 8-2 mg Sublingual Film Place 1 Film under the tongue Twice daily Indications: prevention of relapse to opioid dependence    busPIRone (BUSPAR) 10 mg Oral Tablet Take 2 Tablets (20 mg total) by mouth Twice daily    citalopram (CELEXA) 20 mg Oral Tablet Take 1 Tablet (20 mg total) by mouth Once a day    cloNIDine HCL (CATAPRES) 0.1 mg Oral Tablet Take  1 Tablet (0.1 mg total) by mouth Three times a day as needed (opioid withdrawal) Indications: symptoms from stopping treatment with opioid drugs    lidocaine 4 % Adhesive Patch, Medicated Place 1 Patch on the skin Once a day    Methylprednisolone (MEDROL DOSEPACK) 4 mg Oral Tablets, Dose Pack Take as instructed.    QUEtiapine (SEROQUEL) 100 mg Oral Tablet Take 1 Tablet (100 mg total) by mouth Three times a day      OBJECTIVE:  MSE: Dressed appropriately, AOX3, Mood euthymic, No psychomotor agitation or retardation. Cooperative. Speech was at a normal rate and volume. Thought process was linear and goal directed. Thought content was appropriate. Judgment and insight are fair-good.    ASSESSMENT:  Opioid Use Disorder F11.20    PLAN:   Continue COAT Clinic and current medication.  Reviewed the risk/benefits and rationale for buprenorphine and buprenorphine/naloxone for the treatment of opioid use disorder.  Tapering and dose reduction are routinely discussed when appropriate.  Tobacco cessation is routinely encouraged. Various treatments are made available to those who are interested.  Bowel regimen to avoid constipation is routinely discussed/recommended.   Referrals to psychiatric evaluation are recommended for complaints of concerning for comorbid psychiatric conditions.  Establishing with a PCP is routinely recommended for health care maintenance and for w/u regarding risk factors related to addiction.  Continue recovery work, including required [employer].  Continue random urine drugs screens and random pill/strip counts and [place] audits.  Return as Scheduled.   This is a focused med  group. Full problem list not reviewed in this setting.  Only those problems relevant to group or voiced by pt were discussed.    Trevor Iha, MD 01/31/2022, 10:33

## 2022-02-01 ENCOUNTER — Encounter (HOSPITAL_COMMUNITY): Payer: Self-pay

## 2022-02-01 NOTE — Progress Notes (Signed)
[place] Controlled Substance Full Name Report Report Date 02/01/2022   From 11/01/2021 To 01/31/2022 Date of Birth 1986-01-07 Prescription Count 11   Last Name [place] First Name [place] Name                                      Patients included in report that appear to match the search criteria.   Last Name First Name Middle Name Gender Address   [place] [address] , [place], [place], [address]            Prescriber Name Prescriber [place] Name [place] Rx Written Date Rx Dispense Date  & Date Sold    Rx Number Product Name MEDD Status Strength Qty Days # of Refill Sched Payment Type   Naleah Kofoed [address], [place], [place], [place] [id number] [place] [id number] [phone] 01/17/2022 01/17/2022 01/18/2022 [id number] Suboxone Film INACTIVE 8 mg/1; 2 mg/1 28 14  0/0 CIII Medicaid   Luetta Nutting [id number] [place] [id number] 01/03/2022 01/04/2022 01/04/2022 [id number] Suboxone Film INACTIVE 8 mg/1; 2 mg/1 28 14  0/0 CIII Medicaid   Luetta Nutting [id number] [place] [id number] 12/27/2021 12/27/2021 12/30/2021 [id number] Suboxone Film INACTIVE 8 mg/1; 2 mg/1 14 7  0/0 CIII Medicaid   Luetta Nutting [id number] [place] [id number] 12/20/2021 12/20/2021 12/21/2021 [id number] Suboxone Film INACTIVE 8 mg/1; 2 mg/1 14 7  0/0 CIII Medicaid   Luetta Nutting [id number] [place] [id number] 12/13/2021 12/13/2021 12/14/2021 [id number] Suboxone Film INACTIVE 8 mg/1; 2 mg/1 14 7  0/0 CIII Medicaid   Luetta Nutting [id number] [place] [id number] 12/06/2021 12/06/2021 12/07/2021 [id number] Suboxone Film INACTIVE 8 mg/1; 2 mg/1 14 7  0/0 CIII Medicaid   Luetta Nutting [id number] [place] [id number] 11/29/2021 11/29/2021 12/01/2021 [id number] Suboxone Film INACTIVE 8 mg/1; 2 mg/1 14 7  0/0 CIII Medicaid   Luetta Nutting [id number] [place] [id number] 11/23/2021 11/25/2021 11/27/2021 [id number] Suboxone Film INACTIVE 8 mg/1;  2 mg/1 6 3  0/0 CIII Medicaid   Luetta Nutting [id number] [place] [id number] 11/15/2021 11/18/2021 11/18/2021 [id number] Suboxone Film INACTIVE 8 mg/1; 2 mg/1 14 7  0/0 CIII Medicaid   Luetta Nutting [id number] [place] [id number] 11/08/2021 11/12/2021 11/12/2021 [id number] Suboxone Film INACTIVE 8 mg/1; 2 mg/1 14 7  0/0 CIII Medicaid   Ermalene Postin, Fnp-Bc [id number] Rayetta Pigg [id number] 11/01/2021 11/04/2021 11/04/2021 [id number] Suboxone Film INACTIVE 8 mg/1; 2 mg/1 14 7  0/0 CIII Medicaid            Sela Hua, CASE MANAGER

## 2022-02-13 ENCOUNTER — Ambulatory Visit (HOSPITAL_COMMUNITY): Payer: Self-pay

## 2022-02-13 NOTE — Telephone Encounter (Signed)
Person notified by phone only d/t no active MyChart to present to [facility] by 4pm, [facility] on 02/15/22 for OBSERVED (NO EXCEPTIONS) urine drug screen.  Pt to be sent out for ETGR, GABA , BUP, and FENT       Alayja Armas, CT  02/13/2022, 13:45

## 2022-02-14 ENCOUNTER — Telehealth: Payer: MEDICAID | Admitting: Clinical

## 2022-02-14 ENCOUNTER — Telehealth: Payer: MEDICAID | Admitting: Psychiatry

## 2022-02-14 ENCOUNTER — Other Ambulatory Visit: Payer: Self-pay

## 2022-02-14 DIAGNOSIS — F1120 Opioid dependence, uncomplicated: Secondary | ICD-10-CM

## 2022-02-14 DIAGNOSIS — F1190 Opioid use, unspecified, uncomplicated: Secondary | ICD-10-CM

## 2022-02-14 DIAGNOSIS — Z79899 Other long term (current) drug therapy: Secondary | ICD-10-CM

## 2022-02-14 MED ORDER — BUPRENORPHINE 8 MG-NALOXONE 2 MG SUBLINGUAL FILM
1.0000 | ORAL_FILM | Freq: Two times a day (BID) | SUBLINGUAL | 0 refills | Status: DC
Start: 2022-02-14 — End: 2022-03-14

## 2022-02-14 NOTE — Progress Notes (Signed)
COAT TELE-Clinic Progress Note  TELEMEDICINE DOCUMENTATION:    Patient Location:  [address]  [place] [address]   Patient/family aware of provider location:  YES  Patient/family consent for telemedicine:  YES    Due to COVID-19 concerns patient presents for virtual follow-up to the COAT Clinic. The group modality was facilitated via Zoom.     The patient was seen as part of a collaborative telemedicine service with their COAT treatment team (case mgr, therapist, prescriber) who participated in the encounter by active presence via approved video/audio means for portions of the encounter.  This clinic visit included medication management, addiction education, and evaluation of progress in recovery.     This visit included extensive discussion regarding proper etiquette for use of tele in a group based format.    Examination observed and performed by:  Allayne Gitelman. Allyson Sabal, DO CO[facility] Clinic Tele-Video-Audio Progress Note  Patient Name:  Cheryl [name]  MRN: [id number]  Date: 02/14/2022   Chief Complaint: "Addiction"  Current MAT: Buprenorphine   16 mg  Subjective:    Patient has been sober for 152 days.   Moved into a new place. 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 15Finished RC program. Started a new job at [employer]. Doing onlinie recovery work.  Cravings-None   Withdrawal Symptoms- None  Side Effects-None    Motivation is good    General Exam:  Patient is in no acute distress  alert and oriented      Mental Status:  Speech is clear and coherent.  Thought process is linear.  No indication of responding to internal stimuli.  Affect is stable  Attention is sustained  Concentration is sustained  Insight is good    Assessment:  Opioid Dependence 304.9 - Stable        Plan:  Continue COAT Clinic and medication.  [address]  Coordination of Care:  Coordination of Care:  Patient was seen in the COAT Clinic in the Department of Behavioral Medicine and Psychiatry at  [facility] in [place], [place]. This clinic visit included medication management, addiction education, and evaluation of progress in recovery in a group setting. Patient's progress, psychosocial functioning and treatment response was discussed during the treatment team meeting with the case manager and/or therapist to assist in medical decision-making.    [name], DO 02/14/2022

## 2022-02-15 ENCOUNTER — Other Ambulatory Visit: Payer: MEDICAID | Attending: Psychiatry | Admitting: Psychiatry

## 2022-02-15 ENCOUNTER — Ambulatory Visit (INDEPENDENT_AMBULATORY_CARE_PROVIDER_SITE_OTHER): Payer: MEDICAID

## 2022-02-15 DIAGNOSIS — F1120 Opioid dependence, uncomplicated: Secondary | ICD-10-CM | POA: Insufficient documentation

## 2022-02-15 LAB — CREATININE URINE, RANDOM: CREATININE RANDOM URINE: 267 mg/dL — ABNORMAL HIGH (ref 50–100)

## 2022-02-15 NOTE — Progress Notes (Signed)
COAT GROUP BI-WEEKLY PROGRESS NOTE    GROUP THERAPY    NAME: Cheryl [name]  Date of Service: 02/14/2022  TIME IN: 2:30  TIME OUT: 3:30  CPT CODE: 12751, [phone]  CHIEF COMPLAINT: Group Therapy for Addiction  NUMBER IN GROUP: 10    TELEMEDICINE DOCUMENTATION:    Patient Location:  MyChart video visit from home address: [address]  [place] [address]    Patient/family aware of provider location:  yes  Patient/family consent for telemedicine:  yes  Examination observed and performed by:   Charolett Bumpers, Mallie Snooks, PhD and observed by Governor Specking, MSW student    Subjective: Patient seen for group therapy.  Patient has 152 days without the use of substances and has attended required meetings. Provided psycho education to group member on identifying high risk situations for relapse and looking at times when members were confronted by these examples. Explored when they were successful in avoiding relapse and what coping skills were utilized. Discussed times when relapse occurred and what skills could have been implemented.     Observation:  Mood: within normal limits  Affect: congruent to mood and normal range   Participation: Active participant    Assessment/Diagnosis: Opioid Use Disorder, with dependence, on maintenance therapy (F11.20)    Procedure: Patient attended the intermediate bi-weekly COAT Group. The focus of this group to educate about the process of  building a sound recovery program, to facilitate the understanding of the disease of addiction and how Suboxone works to aid in the development of recovery, and to support the practice of new recovery tools. Encouragement of attendance at 12 step recovery meetings, identification of triggers for use/ relapse and the development of a supportive social network were also covered. Patient has moved into a new home and finished the recovery coach program. Patient is watching videos to support recovery.     Plan: Abstinence, take suboxone as prescribed, attend at  least 4 NA/AA meetings a week and return to clinic in two weeks.    Charolett Bumpers, Mallie Snooks, PhD  Associate Professor  Department of Behavioral Medicine and Psychiatry

## 2022-02-15 NOTE — Nursing Note (Signed)
02/15/22 0900   Drug Screen Results   Amphetamine (AMP) Negative   Barbiturates (BAR) Negative   BUP - Cut off Levels 10 ng/ml Positive   Benzodiazepine (BZO) Negative   Cocaine (COC) Negative   Methamphetamine (MET) Negative   Methadone (MTD) Negative   Morphine (MOP) Negative   Oxycodone (OXY) Negative   Propoxyphine (PPX) Negative   Marijuana (THC) Negative   Ecstasy (MDMA) Negative   Phencyclidine (PCP) Negative   Tricyclic Antidepressant (TCA) Negative   Temperature within range? yes   Observed yes   Tester [name]   Physician Gwenlyn Found   Lot # [id number]   Expiration Date 04/20/2023   Internal Control Valid yes   Initials CND     Days Sober Reported PRIOR to being asked for urine drug screen:  153  on 02/15/2022 with No Relapse       If relapse, on what?      Other concerns to be notes:     Patient sent out for ETGR, GABA , BUP, and FENT     Cheryl Kostka, MA  02/15/2022, 09:13

## 2022-02-19 LAB — FENTANYL CONFIRMATORY/DEFINITIVE, URINE, BY LC-MS/MS (PERFORMABLE)
FENTANYL INTERPRETATION: NEGATIVE
FENTANYL: NOT DETECTED ng/mL (ref <0.5)
NORFENTANYL: NOT DETECTED ng/mL (ref <2)

## 2022-02-20 LAB — ETHYL GLUCURONIDE AND ETHYL SULFATE, URINE
ETHYL GLUCURONIDE: NEGATIVE
ETHYL SULFATE: NEGATIVE

## 2022-02-20 LAB — SUBOXONE CONFIRMATORY/DEFINITIVE, URINE, BY LC-MS/MS (PERFORMABLE)
BUPRENORPHINE: 193 ng/mL — ABNORMAL HIGH (ref <10)
NALOXONE: >1000 ng/mL — ABNORMAL HIGH (ref <5)
NORBUPRENORPHINE: >1000 ng/mL — ABNORMAL HIGH (ref <10)

## 2022-02-20 LAB — ETHYL GLUCURONIDE & ETHYL SULFATE (ALCOHOL METABOLITES),URINE
CREATININE RANDOM URINE: 267 mg/dL — ABNORMAL HIGH (ref 50–100)
ETHYL GLUCURONIDE: NEGATIVE
ETHYL SULFATE: NEGATIVE

## 2022-02-20 LAB — NOTES AND COMMENTS

## 2022-02-20 LAB — DRUG MONITORING, GABAPENTIN, QUANTITATIVE, URINE: GABAPENTIN: NEGATIVE ng/mL (ref <1000)

## 2022-02-22 ENCOUNTER — Encounter (HOSPITAL_COMMUNITY): Payer: Self-pay

## 2022-02-22 NOTE — Progress Notes (Signed)
[facility]  [place] Controlled Substance Full Name Report Report Date 02/22/2022   From 11/22/2021 To 02/21/2022 Date of Birth 1985-05-13 Prescription Count 10   Last Name [name] First Name Cheryl Middle Name                                      Patients included in report that appear to match the search criteria.   Last Name First Name Middle Name Gender Address   Cheryl [name]   F [address] , [place], [place], [address]            Prescriber Name Prescriber DEA & Zip Dispenser Name Dispenser DEA & Zip Rx Written Date Rx Dispense Date  & Date Sold    Rx Number Product Name MEDD Status Strength Qty Days # of Refill Sched Payment Type   Yenny Kosa [address], [place], [address]   [name] [id number] [address] [address] [id number] [phone] 02/14/2022 02/14/2022 02/15/2022 [id number] Suboxone Film ACTIVE 8 mg/1; 2 mg/1 56 28 0/0 CIII Medicaid   [name] [id number] [address] [employer] [id number] [phone] 01/31/2022 01/31/2022 01/31/2022 [id number] Suboxone Film INACTIVE 8 mg/1; 2 mg/1 28 14  0/0 CIII Medicaid   [name] [id number] [address] [employer] [id number] [phone] 01/17/2022 01/17/2022 01/18/2022 [id number] Suboxone Film INACTIVE 8 mg/1; 2 mg/1 28 14  0/0 CIII Medicaid   [name] [id number] [address] [employer] [id number] [phone] 01/03/2022 01/04/2022 01/04/2022 [id number] Suboxone Film INACTIVE 8 mg/1; 2 mg/1 28 14  0/0 CIII Medicaid   [name] [id number] [address] [employer] [id number] [phone] 12/27/2021 12/27/2021 12/30/2021 [id number] Suboxone Film INACTIVE 8 mg/1; 2 mg/1 14 7  0/0 CIII Medicaid   [name] [id number] [address] [employer] [id number] [phone] 12/20/2021 12/20/2021 12/21/2021 [id number] Suboxone Film INACTIVE 8 mg/1; 2 mg/1 14 7  0/0 CIII Medicaid   [name] [id number] [address] [employer] [id number] [phone] 12/13/2021 12/13/2021 12/14/2021 [id number] Suboxone Film INACTIVE 8 mg/1; 2 mg/1 14 7  0/0 CIII Medicaid   [name] [id number] [address] [employer] [id number] [phone] 12/06/2021 12/06/2021 12/07/2021 [id number] Suboxone  Film INACTIVE 8 mg/1; 2 mg/1 14 7  0/0 CIII Medicaid   [name] [id number] [address] [employer] [id number] [phone] 11/29/2021 11/29/2021 12/01/2021 [id number] Suboxone Film INACTIVE 8 mg/1; 2 mg/1 14 7  0/0 CIII Medicaid   [name] [id number] [address] [employer] [id number] [phone] 11/23/2021 11/25/2021 11/27/2021 [id number] Suboxone Film INACTIVE 8 mg/1; 2 mg/1 6 3  0/0 CIII Medicaid            [name], CASE MANAGER

## 2022-03-14 ENCOUNTER — Telehealth: Payer: MEDICAID | Admitting: Clinical

## 2022-03-14 ENCOUNTER — Telehealth: Payer: MEDICAID | Admitting: Emergency Medicine

## 2022-03-14 ENCOUNTER — Other Ambulatory Visit: Payer: Self-pay

## 2022-03-14 DIAGNOSIS — Z79899 Other long term (current) drug therapy: Secondary | ICD-10-CM

## 2022-03-14 DIAGNOSIS — F1190 Opioid use, unspecified, uncomplicated: Secondary | ICD-10-CM

## 2022-03-14 DIAGNOSIS — F1120 Opioid dependence, uncomplicated: Secondary | ICD-10-CM

## 2022-03-14 MED ORDER — BUPRENORPHINE 8 MG-NALOXONE 2 MG SUBLINGUAL FILM
1.0000 | ORAL_FILM | Freq: Two times a day (BID) | SUBLINGUAL | 0 refills | Status: DC
Start: 2022-03-14 — End: 2022-03-28

## 2022-03-14 NOTE — Progress Notes (Signed)
COAT TELE-Clinic Progress Note  TELEMEDICINE DOCUMENTATION:    Patient Location:  [address]  [place] [address]   Patient/family aware of provider location:  YES  Patient/family consent for telemedicine:  YES    Due to COVID-19 concerns patient presents for virtual follow-up to the COAT Clinic. The group modality was facilitated via Zoom.     The patient was seen as part of a collaborative telemedicine service with their COAT treatment team (case mgr, therapist, prescriber) who participated in the encounter by active presence via approved video/audio means for portions of the encounter.  This clinic visit included medication management, addiction education, and evaluation of progress in recovery.     This visit included extensive discussion regarding proper etiquette for use of tele in a group based format.    Examination observed and performed by:  [name], MD COAT Clinic Tele-Video-Audio Progress Note  Patient Name:  Cheryl [name]  MRN: [id number]  Date: 03/14/2022   Chief Complaint: "Addiction"  Current MAT: Buprenorphine   16 mg  Subjective:    Patient has been sober for 180 days.   Continues to stay very busy, recovery is going well  Cravings-None   Withdrawal Symptoms- None  Side Effects-None    Motivation is good    General Exam:  Patient is in no acute distress  alert and oriented      Mental Status:  Speech is clear and coherent.  Thought process is linear.  No indication of responding to internal stimuli.  Affect is stable  Attention is sustained  Concentration is sustained  Insight is good    Assessment:  Opioid Dependence 304.9 - Stable        Plan:  Continue COAT Clinic and medication.  [id number]  Coordination of Care:  Coordination of Care:  Patient was seen in the COAT Clinic in the Department of Behavioral Medicine and Psychiatry at [facility] in [place], [place]. This clinic visit included medication management, addiction education, and evaluation of progress in recovery in a group  setting. Patient's progress, psychosocial functioning and treatment response was discussed during the treatment team meeting with the case manager and/or therapist to assist in medical decision-making.    [name], MD

## 2022-03-15 ENCOUNTER — Encounter (HOSPITAL_COMMUNITY): Payer: Self-pay

## 2022-03-15 NOTE — Progress Notes (Signed)
[facility]  [place] Cont[place] Sub[place] Full Name Report Report Date 03/15/2022   From 12/14/2021 To 03/14/2022 Date of Birth 06/23/1985 Prescription Count 6   Last Name Wojtkiewicz First Name Relena Middle Name                                      Patients included in report that appear to match the search criteria.   Last Name First Name Middle Name Gender Address   [place]   F [address] , [place], [place], [address]            Prescriber Name Prescriber DEA & Zip Dispenser Name Dispenser DEA & Zip Rx Written Date Rx Dispense Date  & Date Sold    Rx Number Product Name MEDD Status Strength Qty Days # of Refill Sched Payment Type   Cele Mote [address], [place], [address]   [name] [id number] [address] [address] [id number] [phone] 02/14/2022 02/14/2022 02/15/2022 [id number] Suboxone Film INACTIVE 8 mg/1; 2 mg/1 56 28 0/0 CIII Medicaid   [name] [id number] [address] [employer] [id number] [phone] 01/31/2022 01/31/2022 01/31/2022 [id number] Suboxone Film INACTIVE 8 mg/1; 2 mg/1 28 14  0/0 CIII Medicaid   [name] [id number] [address] [employer] [id number] [phone] 01/17/2022 01/17/2022 01/18/2022 [id number] Suboxone Film INACTIVE 8 mg/1; 2 mg/1 28 14  0/0 CIII Medicaid   [name] [id number] [address] [employer] [id number] [phone] 01/03/2022 01/04/2022 01/04/2022 [id number] Suboxone Film INACTIVE 8 mg/1; 2 mg/1 28 14  0/0 CIII Medicaid   [name] [id number] [address] [employer] [id number] [phone] 12/27/2021 12/27/2021 12/30/2021 [id number] Suboxone Film INACTIVE 8 mg/1; 2 mg/1 14 7  0/0 CIII Medicaid   [name] [id number] [address] [employer] [id number] [phone] 12/20/2021 12/20/2021 12/21/2021 [id number] Suboxone Film INACTIVE 8 mg/1; 2 mg/1 14 7  0/0 CIII Medicaid            [name], CASE MANAGER

## 2022-03-15 NOTE — Progress Notes (Signed)
COAT GROUP BI-WEEKLY PROGRESS NOTE    GROUP THERAPY    NAME: Cheryl [name]  Date of Service: 03/14/2022  TIME IN: 2:30  TIME OUT: 3:30  CPT CODE: 93818  CHIEF COMPLAINT: Group Therapy for Addiction  NUMBER IN GROUP: 10    TELEMEDICINE DOCUMENTATION:    Patient Location:  MyChart video visit from home address: [address]  [place] [address]    Patient/family aware of provider location:  yes  Patient/family consent for telemedicine:  yes  Examination observed and performed by:   Charolett Bumpers, PhD, LISCW and observed by Governor Specking, MSW Student      Subjective: Patient seen for group therapy.  Patient has 180 days without the use of substances and has attended required meetings.     Provided psycho education to group member on identifying high risk situations for relapse and looking at times when members were confronted by these examples. Explored when they were successful in avoiding relapse and what coping skills were utilized. Discussed times when relapse occurred and what skills could have been implemented.     Observation:  Mood: within normal limits  Affect: congruent to mood and normal range   Participation: Active participant        Assessment/Diagnosis: Opioid Use Disorder, with dependence, on maintenance therapy (F11.20)    Procedure: Patient attended the intermediate bi-weekly COAT Group. The focus of this group to educate about the process of  building a sound recovery program, to facilitate the understanding of the disease of addiction and how Suboxone works to aid in the development of recovery, and to support the practice of new recovery tools. Encouragement of attendance at 12 step recovery meetings, identification of triggers for use/ relapse and the development of a supportive social network were also covered.    Plan: Abstinence, take suboxone as prescribed, attend at least 4 NA/AA meetings a week and return to clinic in two weeks.    Charolett Bumpers, Mallie Snooks, PhD  Associate  Professor  Department of Behavioral Medicine and Psychiatry

## 2022-03-28 ENCOUNTER — Telehealth: Payer: MEDICAID | Admitting: Psychiatry

## 2022-03-28 ENCOUNTER — Other Ambulatory Visit: Payer: Self-pay

## 2022-03-28 ENCOUNTER — Telehealth: Payer: MEDICAID | Admitting: Clinical

## 2022-03-28 DIAGNOSIS — F1190 Opioid use, unspecified, uncomplicated: Secondary | ICD-10-CM

## 2022-03-28 DIAGNOSIS — Z79899 Other long term (current) drug therapy: Secondary | ICD-10-CM

## 2022-03-28 DIAGNOSIS — F1120 Opioid dependence, uncomplicated: Secondary | ICD-10-CM

## 2022-03-28 MED ORDER — BUPRENORPHINE 8 MG-NALOXONE 2 MG SUBLINGUAL FILM
1.0000 | ORAL_FILM | Freq: Two times a day (BID) | SUBLINGUAL | 0 refills | Status: DC
Start: 2022-03-28 — End: 2022-04-11

## 2022-03-28 NOTE — Progress Notes (Signed)
COAT TELE-Clinic Progress Note  TELEMEDICINE DOCUMENTATION:    Patient Location:  [address]  [place] [address]   Patient/family aware of provider location:  YES  Patient/family consent for telemedicine:  YES    Due to COVID-19 concerns patient presents for virtual follow-up to the COAT Clinic. The group modality was facilitated via Zoom.     The patient was seen as part of a collaborative telemedicine service with their COAT treatment team (case mgr, therapist, prescriber) who participated in the encounter by active presence via approved video/audio means for portions of the encounter.  This clinic visit included medication management, addiction education, and evaluation of progress in recovery.     This visit included extensive discussion regarding proper etiquette for use of tele in a group based format.    Examination observed and performed by:  Allayne Gitelman. Allyson Sabal, DO COAT Clinic Tele-Video-Audio Progress Note  Patient Name:  Cheryl [name]  MRN: [id number]  Date: 03/28/2022   Chief Complaint: "Addiction"  Current MAT: Buprenorphine   16 mg  Subjective:    Patient has been sober for 194 days.   Doing well. Will be visiting family for the holidays.  Cravings-None   Withdrawal Symptoms- None  Side Effects-None    Motivation is good    General Exam:  Patient is in no acute distress  alert and oriented      Mental Status:  Speech is clear and coherent.  Thought process is linear.  No indication of responding to internal stimuli.  Affect is stable  Attention is sustained  Concentration is sustained  Insight is good    Assessment:  Opioid Dependence 304.9 - Stable        Plan:  Continue COAT Clinic and medication.  [address]  Coordination of Care:  Coordination of Care:  Patient was seen in the COAT Clinic in the Department of Behavioral Medicine and Psychiatry at [facility] in [place], [place]. This clinic visit included medication management, addiction education, and evaluation of progress in recovery in a  group setting. Patient's progress, psychosocial functioning and treatment response was discussed during the treatment team meeting with the case manager and/or therapist to assist in medical decision-making.    [name], DO 03/28/2022

## 2022-03-29 NOTE — Progress Notes (Signed)
COAT GROUP BI-WEEKLY PROGRESS NOTE    GROUP THERAPY    NAME: Cheryl [name]  Date of Service: 03/28/2022  TIME IN: 2:30  TIME OUT: 3:30  CPT CODE: 35009, [phone]  CHIEF COMPLAINT: Group Therapy for Addiction  NUMBER IN GROUP: 11    TELEMEDICINE DOCUMENTATION:    Patient Location:  MyChart video visit from home address: [address]  [place] [address]    Patient/family aware of provider location:  yes  Patient/family consent for telemedicine:  yes  Examination observed and performed by:   Charolett Bumpers, Mallie Snooks, PhD and observed by Tobias Alexander, MSW student       Subjective: Patient seen for group therapy.  Patient has 194 days without the use of substances and has attended required meetings. Provided psycho education to group member on identifying high risk situations for relapse and looking at times when members were confronted by these examples. Explored when they were successful in avoiding relapse and what coping skills were utilized. Discussed times when relapse occurred and what skills could have been implemented. Patient reported being excited to see family over the holidays and is watching Felicity Pellegrini Talks to support recovery.     Observation:  Mood: within normal limits  Affect: congruent to mood and normal range   Participation: Active participant    Assessment/Diagnosis: Opioid Use Disorder, with dependence, on maintenance therapy (F11.20)    Procedure: Patient attended the intermediate bi-weekly COAT Group. The focus of this group to educate about the process of  building a sound recovery program, to facilitate the understanding of the disease of addiction and how Suboxone works to aid in the development of recovery, and to support the practice of new recovery tools. Encouragement of attendance at 12 step recovery meetings, identification of triggers for use/ relapse and the development of a supportive social network were also covered.    Plan: Abstinence, take suboxone as prescribed, attend at least 4  NA/AA meetings a week and return to clinic in two weeks.    Charolett Bumpers, Mallie Snooks, PhD  Associate Professor  Department of Behavioral Medicine and Psychiatry

## 2022-04-08 ENCOUNTER — Telehealth: Payer: MEDICAID

## 2022-04-08 DIAGNOSIS — K047 Periapical abscess without sinus: Secondary | ICD-10-CM

## 2022-04-08 MED ORDER — AMOXICILLIN 875 MG-POTASSIUM CLAVULANATE 125 MG TABLET
1.0000 | ORAL_TABLET | Freq: Two times a day (BID) | ORAL | 0 refills | Status: AC
Start: 2022-04-08 — End: 2022-04-15

## 2022-04-08 NOTE — Progress Notes (Signed)
URGENT CARE, [facility]  [place] [address]    Video Visit     Name:  Cheryl [name] MRN: [id number]   Date:    04/08/2022 Age:  37 y.o.                                      Patient's location: [place] [address]   Patient/family aware of provider location: Yes  Patient/family consent for video visit: Yes  Interview and observation performed by: Wynonia Musty, APRN,NP-C      Iona Beard    Chief Complaint: Facial Pain and Dental Problem    History of Present Illness:  Cheryl [name] is a 37 y.o. female   Upper tooth infection. Swelling around the tooth. No drainage from around the tooth  No recent antibiotics  Also having sore throat and body aches.    No Chance of pregnancy      Past Medical History:  No past medical history on file.      Past Surgical History:   Procedure Laterality Date    HX CESAREAN SECTION      OVARY SURGERY          Allergies:  No Known Allergies    Medications:    amoxicillin-pot clavulanate    buprenorphine-naloxone    busPIRone    citalopram    cloNIDine HCL    lidocaine    Methylprednisolone    QUEtiapine     Review of Systems:  See HPI            Observational Exam:    General:  Well appearing and No acute distress  Head:  Normocephalic and Atraumatic  Eyes:  Normal lids/lashes, No conjunctival injection  Neck:  supple,  Pulmonary:  normal effort and rate  Skin:  no visible rash  Psychiatric:  Appropriate affect and behavior and Normal speech      Differential Diagnosis: dental infection, uri, strep    Assessment:   Assessment/Plan   1. Dental infection        Plan:    Orders Placed This Encounter    amoxicillin-pot clavulanate (AUGMENTIN) 875-125 mg Oral Tablet        Augmentin to the pharmacy. Work note given. Provided info for dental clinic. If symptoms do not improve I reccommend an evaluation in person.     Plan was discussed and patient/parent/guardian verbalized understanding.  If symptoms are not improving the patient  should be seen in person by their PCP or at Urgent Care for further evaluation. If symptoms are worsening or emergent present to Emergency Department for higher level of evaluation and care.      Wynonia Musty, APRN,NP-C 04/08/2022, 12:06

## 2022-04-08 NOTE — Patient Instructions (Signed)
URGENT CARE, [facility] URGENT CARE  [place] [address]  Dept: [phone]        Patient Name: Cheryl [name]  DOB: [birth year]        Date:      Order Name: RETURN TO WORK/SCHOOL           Order Questions:  Patient May Return to Work: 04/10/2022  Patient Was Seen in Clinic/ED on: 04/08/2022              Order Providers      Wynonia Musty, APRN,NP-C      [facility]  Appointments are available Monday through Friday and are normally scheduled from 9:00 am to 12:00 pm and 1:00 pm to 5:00 pm.   Call: [phone]   After Hours: [phone]   Emergency Clinic  Urgent dental care is available on a walk-in, first-come, first-served basis in the dental school Urgent Care Clinic. These services are available from Monday through Friday during normal business hours. A $75 prepayment is collected at the time of registration. .  For after hours emergencies that require immediate attention, patients may call the [facility] at [phone] to discuss the problem and arrange treatment.  Call: [phone]   After Hours: [phone]   [facility]  In addition to the clinical services described above, the dental hygiene program offers a large scope of services for patients treated in the student clinic    The [facility] on the first floor of [place] in the [place]. As you approach the [facility], an officer at the security booth will provide you with specific instructions for parking. To get to the School of Dentistry, enter the [employer] at the [address] entrance of [facility].

## 2022-04-11 ENCOUNTER — Other Ambulatory Visit: Payer: Self-pay

## 2022-04-11 ENCOUNTER — Telehealth: Payer: MEDICAID | Admitting: Psychiatry

## 2022-04-11 ENCOUNTER — Telehealth: Payer: MEDICAID | Admitting: Clinical

## 2022-04-11 DIAGNOSIS — F1190 Opioid use, unspecified, uncomplicated: Secondary | ICD-10-CM

## 2022-04-11 DIAGNOSIS — F1120 Opioid dependence, uncomplicated: Secondary | ICD-10-CM

## 2022-04-11 DIAGNOSIS — Z79899 Other long term (current) drug therapy: Secondary | ICD-10-CM

## 2022-04-11 MED ORDER — BUPRENORPHINE 8 MG-NALOXONE 2 MG SUBLINGUAL FILM
1.0000 | ORAL_FILM | Freq: Two times a day (BID) | SUBLINGUAL | 0 refills | Status: DC
Start: 2022-04-11 — End: 2022-04-25

## 2022-04-11 NOTE — Progress Notes (Signed)
COAT TELE-Clinic Progress Note  TELEMEDICINE DOCUMENTATION:    Patient Location:  [place] [address]   Patient/family aware of provider location:  YES  Patient/family consent for telemedicine:  YES    Due to COVID-19 concerns patient presents for virtual follow-up to the [facility]. The group modality was facilitated via Zoom.     The patient was seen as part of a collaborative telemedicine service with their COAT treatment team (case mgr, therapist, prescriber) who participated in the encounter by active presence via approved video/audio means for portions of the encounter.  This clinic visit included medication management, addiction education, and evaluation of progress in recovery.     This visit included extensive discussion regarding proper etiquette for use of tele in a group based format.    Examination observed and performed by:  Nikki Dom. Gwenlyn Found, [facility] Tele-Video-Audio Progress Note  Patient Name:  Cheryl [name]  MRN: [id number]  Date: 04/11/2022   Chief Complaint: "Addiction"  Current MAT: Buprenorphine   16 mg  Subjective:    Patient has been sober for 208 days.   "I'm taking good care of myself through the holiday." Very glad to be sober. Family was very thankful.   Cravings-None   Withdrawal Symptoms- None  Side Effects-None    Motivation is good    General Exam:  Patient is in no acute distress  alert and oriented      Mental Status:  Speech is clear and coherent.  Thought process is linear.  No indication of responding to internal stimuli.  Affect is stable  Attention is sustained  Concentration is sustained  Insight is good    Assessment:  Opioid Dependence 304.9 - Stable        Plan:  Continue COAT Clinic and medication.  [address]  Coordination of Care:  Coordination of Care:  Patient was seen in the [facility] in the Department of Behavioral Medicine and Psychiatry at [facility] in [place], [place]. This clinic visit included medication management, addiction education,  and evaluation of progress in recovery in a group setting. Patient's progress, psychosocial functioning and treatment response was discussed during the treatment team meeting with the case manager and/or therapist to assist in medical decision-making.    Luetta Nutting, DO 04/11/2022

## 2022-04-20 ENCOUNTER — Encounter (HOSPITAL_COMMUNITY): Payer: Self-pay

## 2022-04-20 NOTE — Progress Notes (Signed)
[place] Controlled Substance Full Name Report Report Date 04/20/2022   From 01/18/2022 To 04/19/2022 Date of Birth 30-May-1985 Prescription Count 5   Last Name [place] First Name [place] Name                                      Patients included in report that appear to match the search criteria.   Last Name First Name Middle Name Gender Address   [address] , [place], [place], [address]            Prescriber Name Prescriber [place] Name [place] Zip Rx Written Date Rx Dispense Date  & Date Sold    Rx Number Product Name MEDD Status Strength Qty Days # of Refill Sched Payment Type   Margie Urbanowicz [address] [place], [place], [place] [id number] [place] [id number] 04/11/2022 04/11/2022 04/12/2022 [id number] Suboxone Film ACTIVE 8 mg/1; 2 mg/1 28 14  0/0 CIII Medicaid   Luetta Nutting [id number] [place]. [id number] 03/28/2022 03/28/2022 03/28/2022 [id number] Suboxone Film INACTIVE 8 mg/1; 2 mg/1 28 14  0/0 CIII Medicaid   Elvia Collum [id number]  [place]. [id number] 03/14/2022 03/14/2022 03/14/2022 [id number] Suboxone Film INACTIVE 8 mg/1; 2 mg/1 28 14  0/0 CIII Medicaid   Luetta Nutting [id number] [place] [id number] 02/14/2022 02/14/2022 02/15/2022 [id number] Suboxone Film INACTIVE 8 mg/1; 2 mg/1 56 28 0/0 CIII Medicaid   Shirlee Limerick [id number] [place] [id number] 01/31/2022 01/31/2022 01/31/2022 [id number] Suboxone Film INACTIVE 8 mg/1; 2 mg/1 28 14  0/0 CIII Medicaid            Sela Hua, CASE MANAGER

## 2022-04-25 ENCOUNTER — Other Ambulatory Visit: Payer: Self-pay

## 2022-04-25 ENCOUNTER — Telehealth (HOSPITAL_COMMUNITY): Payer: MEDICAID | Admitting: Clinical

## 2022-04-25 ENCOUNTER — Telehealth: Payer: MEDICAID | Admitting: Psychiatry

## 2022-04-25 DIAGNOSIS — F1120 Opioid dependence, uncomplicated: Secondary | ICD-10-CM

## 2022-04-25 DIAGNOSIS — F1190 Opioid use, unspecified, uncomplicated: Secondary | ICD-10-CM

## 2022-04-25 DIAGNOSIS — Z79899 Other long term (current) drug therapy: Secondary | ICD-10-CM

## 2022-04-25 MED ORDER — BUPRENORPHINE 8 MG-NALOXONE 2 MG SUBLINGUAL FILM
1.0000 | ORAL_FILM | Freq: Two times a day (BID) | SUBLINGUAL | 0 refills | Status: DC
Start: 2022-04-25 — End: 2022-05-09

## 2022-04-25 NOTE — Progress Notes (Signed)
COAT GROUP BI-WEEKLY PROGRESS NOTE    GROUP THERAPY    NAME: Cheryl [name]  Date of Service: 04/11/2022  TIME IN: 2:30  TIME OUT: 3:30  CPT CODE: 79024, [id number]  CHIEF COMPLAINT: Group Therapy for Addiction  NUMBER IN GROUP: 9    TELEMEDICINE DOCUMENTATION:    Patient Location:  MyChart video visit from home address: [name] [address]    Patient/family aware of provider location:  yes  Patient/family consent for telemedicine:  yes  Examination observed and performed by:   Gavin Potters, Loreli Slot, PhD and observed by Mervyn Skeeters, social work student    Subjective: Patient seen for group therapy.  Patient has >90 days without the use of substances and has attended required meetings. Provided psycho education to group member on identifying high risk situations for relapse and looking at times when members were confronted by these examples. Explored when they were successful in avoiding relapse and what coping skills were utilized. Discussed times when relapse occurred and what skills could have been implemented.     Observation:  Mood: within normal limits  Affect: congruent to mood and normal range   Participation: Active participant    Assessment/Diagnosis: Opioid Use Disorder, with dependence, on maintenance therapy (F11.20)    Procedure: Patient attended the intermediate bi-weekly COAT Group. The focus of this group to educate about the process of  building a sound recovery program, to facilitate the understanding of the disease of addiction and how Suboxone works to aid in the development of recovery, and to support the practice of new recovery tools. Encouragement of attendance at 12 step recovery meetings, identification of triggers for use/ relapse and the development of a supportive social network were also covered.    Plan: Abstinence, take suboxone as prescribed, attend at least 4 NA/AA meetings a week and return to clinic in two weeks.    Gavin Potters, Loreli Slot, PhD  Associate  Professor  Department of Behavioral Medicine and Psychiatry

## 2022-04-25 NOTE — Progress Notes (Signed)
COAT TELE-Clinic Progress Note  TELEMEDICINE DOCUMENTATION:    Patient Location:  [place] [address]   Patient/family aware of provider location:  YES  Patient/family consent for telemedicine:  YES    Due to COVID-19 concerns patient presents for virtual follow-up to the [facility]. The group modality was facilitated via Zoom.     The patient was seen as part of a collaborative telemedicine service with their COAT treatment team (case mgr, therapist, prescriber) who participated in the encounter by active presence via approved video/audio means for portions of the encounter.  This clinic visit included medication management, addiction education, and evaluation of progress in recovery.     This visit included extensive discussion regarding proper etiquette for use of tele in a group based format.    Examination observed and performed by:  Nikki Dom. Gwenlyn Found, [facility] Tele-Video-Audio Progress Note  Patient Name:  Cheryl [name]  MRN: [id number]  Date: 04/25/2022   Chief Complaint: "Addiction"  Current MAT: Buprenorphine   16 mg  Subjective:    Patient has been sober for 222 days.   "I'm doing pretty good." Dealing with a let down from spending time with family. Low energy and mood is also low. Discussed strategies to stay well. Doing virtual meetings and TED talks as well as individual counseling.   Cravings-None   Withdrawal Symptoms- None  Side Effects-None    Motivation is good    General Exam:  Patient is in no acute distress  alert and oriented      Mental Status:  Speech is clear and coherent.  Thought process is linear.  No indication of responding to internal stimuli.  Affect is stable  Attention is sustained  Concentration is sustained  Insight is good    Assessment:  Opioid Dependence 304.9 - Stable        Plan:  Continue COAT Clinic and medication.  [id number]  Coordination of Care:  Coordination of Care:  Patient was seen in the [facility] in the Department of Behavioral Medicine and  Psychiatry at [facility] in [place], [place]. This clinic visit included medication management, addiction education, and evaluation of progress in recovery in a group setting. Patient's progress, psychosocial functioning and treatment response was discussed during the treatment team meeting with the case manager and/or therapist to assist in medical decision-making.    Luetta Nutting, DO 04/25/2022

## 2022-04-26 ENCOUNTER — Encounter (HOSPITAL_COMMUNITY): Payer: Self-pay

## 2022-04-30 ENCOUNTER — Telehealth (INDEPENDENT_AMBULATORY_CARE_PROVIDER_SITE_OTHER): Payer: MEDICAID

## 2022-04-30 ENCOUNTER — Encounter (INDEPENDENT_AMBULATORY_CARE_PROVIDER_SITE_OTHER): Payer: Self-pay

## 2022-04-30 DIAGNOSIS — R059 Cough, unspecified: Secondary | ICD-10-CM

## 2022-04-30 DIAGNOSIS — J029 Acute pharyngitis, unspecified: Secondary | ICD-10-CM

## 2022-04-30 DIAGNOSIS — J069 Acute upper respiratory infection, unspecified: Secondary | ICD-10-CM

## 2022-04-30 MED ORDER — BENZONATATE 200 MG CAPSULE
200.0000 mg | ORAL_CAPSULE | Freq: Three times a day (TID) | ORAL | 0 refills | Status: AC | PRN
Start: 2022-04-30 — End: ?

## 2022-04-30 MED ORDER — PREDNISONE 50 MG TABLET
50.0000 mg | ORAL_TABLET | Freq: Every day | ORAL | 0 refills | Status: AC
Start: 2022-04-30 — End: ?

## 2022-04-30 NOTE — Progress Notes (Signed)
URGENT CARE, [facility]  [place] [address]    Video Visit     Name: Cheryl [name]  MRN: [id number]    Date: 04/30/2022  Age: 37 y.o.                            Patient's location: [place] [address]   Patient/family aware of provider location: Yes  Patient/family consent for video visit: Yes  Interview and observation performed by: Denna Haggard, APRN,AGACNP-BC    Chief Complaint: Congestion    History of Present Illness:  Cheryl [name] is a 37 y.o. female who presents with a sore throat. Associated symptoms include cough and congestion. States she is coughing up green mucus. Onset 4 days ago. No fevers, chills, chest pain. Some mild SOB with coughing. No abdominal pain, urinary or bowel complaints. She is a daily vaper.    Past Medical History:  She has no past medical history on file.    Past Surgical History:  She has a past surgical history that includes hx cesarean section and Ovary surgery.    Problem List:  She does not have a problem list on file.    Medications:    Benzonatate    buprenorphine-naloxone    busPIRone    citalopram    cloNIDine HCL    lidocaine    Methylprednisolone    predniSONE    QUEtiapine     Review of Systems:  Constitutional: No fevers, chills  ENT: + congestion  Respiratory: No breathing difficulties, + cough  Cardiovascular: No chest pain/shortness of breath  All other systems reviewed and are negative      Observational Exam:   Constitutional: No apparent distress  Respiratory: No audible wheeze or increased work of breathing noted on video medium  Cardiovascular: no cyanosis  Neurological: Awake, alert, engaging in coherent conversation      Data Reviewed:   Medical, Surgical, Family and Social History Reviewed      Assessment/Plan:  Will trial symptomatic treatment with steroids given she is a smoker, along with cough medication. Work note provided.      ICD-10-CM    1. Viral URI with cough  J06.9         Orders  Placed This Encounter    predniSONE (DELTASONE) 50 mg Oral Tablet    Benzonatate (TESSALON) 200 mg Oral Capsule     Follow Up:  PCP, ED, UC    Denna Haggard, APRN,AGACNP-BC

## 2022-05-01 ENCOUNTER — Encounter (HOSPITAL_COMMUNITY): Payer: Self-pay

## 2022-05-01 DIAGNOSIS — F1190 Opioid use, unspecified, uncomplicated: Secondary | ICD-10-CM

## 2022-05-01 NOTE — Progress Notes (Signed)
COAT GROUP BI-WEEKLY PROGRESS NOTE    GROUP THERAPY    NAME: Cheryl [name]  Date of Service: 04/25/2022  TIME IN: 2:30  TIME OUT: 3:30  CPT CODE: 93267, [phone]  CHIEF COMPLAINT: Group Therapy for Addiction  NUMBER IN GROUP: 10    TELEMEDICINE DOCUMENTATION:    Patient Location:  MyChart video visit from home address: [place] [address]    Patient/family aware of provider location:  yes  Patient/family consent for telemedicine:  yes  Examination observed and performed by:   Gavin Potters, Loreli Slot, PhD and observed by Mervyn Skeeters, social work student    Subjective: Patient seen for group therapy.  Patient has >90 days without the use of substances and has attended required meetings. Provided psycho education to group member on identifying high risk situations for relapse and looking at times when members were confronted by these examples. Explored when they were successful in avoiding relapse and what coping skills were utilized. Discussed times when relapse occurred and what skills could have been implemented. Patient shared about coping with loneliness and her conflict with her landlord. The group provided advice and support.    Observation:  Mood: within normal limits  Affect: congruent to mood and normal range   Participation: Active participant    Assessment/Diagnosis: Opioid Use Disorder, with dependence, on maintenance therapy (F11.20)    Procedure: Patient attended the intermediate bi-weekly COAT Group. The focus of this group to educate about the process of  building a sound recovery program, to facilitate the understanding of the disease of addiction and how Suboxone works to aid in the development of recovery, and to support the practice of new recovery tools. Encouragement of attendance at 12 step recovery meetings, identification of triggers for use/ relapse and the development of a supportive social network were also covered.    Plan: Abstinence, take suboxone as prescribed, attend at  least 4 NA/AA meetings a week and return to clinic in two weeks.    Gavin Potters, Loreli Slot, PhD  Associate Professor  Department of Behavioral Medicine and Psychiatry

## 2022-05-08 ENCOUNTER — Encounter (HOSPITAL_COMMUNITY): Payer: Self-pay

## 2022-05-08 NOTE — Telephone Encounter (Signed)
Person notified by MyChart only (unable to leave a vm) to present to [facility] by 4pm, [facility] by 330pm on 05/10/22 for OBSERVED (unless absolutely not possible)  urine drug screen.  Pt to be sent out for ETGR, GABA , BUP, and FENT       Cheryl [name], CT  05/08/2022, 13:20

## 2022-05-09 ENCOUNTER — Ambulatory Visit (INDEPENDENT_AMBULATORY_CARE_PROVIDER_SITE_OTHER): Payer: MEDICAID

## 2022-05-09 ENCOUNTER — Telehealth: Payer: MEDICAID | Admitting: Psychiatry

## 2022-05-09 ENCOUNTER — Telehealth: Payer: MEDICAID | Admitting: Clinical

## 2022-05-09 ENCOUNTER — Other Ambulatory Visit: Payer: Self-pay

## 2022-05-09 ENCOUNTER — Other Ambulatory Visit: Payer: MEDICAID | Attending: Psychiatry | Admitting: Psychiatry

## 2022-05-09 DIAGNOSIS — F1120 Opioid dependence, uncomplicated: Secondary | ICD-10-CM

## 2022-05-09 DIAGNOSIS — F1190 Opioid use, unspecified, uncomplicated: Secondary | ICD-10-CM

## 2022-05-09 MED ORDER — BUPRENORPHINE 8 MG-NALOXONE 2 MG SUBLINGUAL FILM
1.0000 | ORAL_FILM | Freq: Two times a day (BID) | SUBLINGUAL | 0 refills | Status: DC
Start: 2022-05-09 — End: 2022-05-23

## 2022-05-09 NOTE — Nursing Note (Signed)
05/09/22 1200   Drug Screen Results   Amphetamine (AMP) Negative   Barbiturates (BAR) Negative   BUP - Cut off Levels 10 ng/ml Positive   Benzodiazepine (BZO) Negative   Cocaine (COC) Negative   Methamphetamine (MET) Negative   Methadone (MTD) Negative   Morphine (MOP) Negative   Oxycodone (OXY) Negative   Propoxyphine (PPX) Negative   Marijuana (THC) Negative   Ecstasy (MDMA) Negative   Phencyclidine (PCP) Negative   Tricyclic Antidepressant (TCA) Negative   Temperature within range? yes   Observed no   Tester Damary Doland   Physician Gwenlyn Found   Lot # [id number]   Expiration Date 09/04/2023   Internal Control Valid yes   Initials CND     Days Sober Reported PRIOR to being asked for urine drug screen:  170  on 05/09/2022 with No Relapse       Other concerns to be notes:     Patient sent out for ETGR, GABA , BUP, and [place], [place]  05/09/2022, 12:37

## 2022-05-09 NOTE — Progress Notes (Signed)
COAT TELE-Clinic Progress Note  TELEMEDICINE DOCUMENTATION:    Patient Location:  [place] [address]   Patient/family aware of provider location:  YES  Patient/family consent for telemedicine:  YES    Due to COVID-19 concerns patient presents for virtual follow-up to the [facility]. The group modality was facilitated via Zoom.     The patient was seen as part of a collaborative telemedicine service with their COAT treatment team (case mgr, therapist, prescriber) who participated in the encounter by active presence via approved video/audio means for portions of the encounter.  This clinic visit included medication management, addiction education, and evaluation of progress in recovery.     This visit included extensive discussion regarding proper etiquette for use of tele in a group based format.    Examination observed and performed by:  Nikki Dom. Gwenlyn Found, [facility] Tele-Video-Audio Progress Note  Patient Name:  Cheryl [name]  MRN: [id number]  Date: 05/09/2022   Chief Complaint: "Addiction"  Current MAT: Buprenorphine   16 mg  Subjective:    Patient has been sober for 236 days.   Contract ended at [employer]. Looking to work at [employer] and waiting for her background check.  Did a couple of meetings each week.   Cravings-None   Withdrawal Symptoms- None  Side Effects-None    Motivation is good    General Exam:  Patient is in no acute distress  alert and oriented      Mental Status:  Speech is clear and coherent.  Thought process is linear.  No indication of responding to internal stimuli.  Affect is stable  Attention is sustained  Concentration is sustained  Insight is good    Assessment:  Opioid Dependence 304.9 - Stable        Plan:  Continue COAT Clinic and medication.  [id number]  Coordination of Care:  Coordination of Care:  Patient was seen in the [facility] in the Department of Behavioral Medicine and Psychiatry at [facility] in [place], [place]. This clinic visit included medication management,  addiction education, and evaluation of progress in recovery in a group setting. Patient's progress, psychosocial functioning and treatment response was discussed during the treatment team meeting with the case manager and/or therapist to assist in medical decision-making.    Luetta Nutting, DO 05/09/2022

## 2022-05-10 LAB — CREATININE URINE, RANDOM: CREATININE RANDOM URINE: 278 mg/dL — ABNORMAL HIGH (ref 50–100)

## 2022-05-10 NOTE — Progress Notes (Signed)
COAT GROUP BI-WEEKLY PROGRESS NOTE    GROUP THERAPY    NAME: Cheryl [name]  Date of Service: 05/09/2022  TIME IN: 2:30  TIME OUT: 3:30  CPT CODE: 77414, [phone]  CHIEF COMPLAINT: Group Therapy for Addiction  NUMBER IN GROUP: 6    TELEMEDICINE DOCUMENTATION:    Patient Location:  MyChart video visit from home address: [place] [address]    Patient/family aware of provider location:  yes  Patient/family consent for telemedicine:  yes  Examination observed and performed by:   Gavin Potters, Loreli Slot, PhD and observed by Mervyn Skeeters, social work student      Subjective: Patient seen for group therapy.  Patient has 236 days without the use of substances and has attended required meetings. Provided psycho education to group member on identifying high risk situations for relapse and looking at times when members were confronted by these examples. Explored when they were successful in avoiding relapse and what coping skills were utilized. Discussed times when relapse occurred and what skills could have been implemented.     Observation:  Mood: within normal limits  Affect: congruent to mood and normal range   Participation: Active participant    Assessment/Diagnosis: Opioid Use Disorder, with dependence, on maintenance therapy (F11.20)    Procedure: Patient attended the intermediate bi-weekly COAT Group. The focus of this group to educate about the process of  building a sound recovery program, to facilitate the understanding of the disease of addiction and how Suboxone works to aid in the development of recovery, and to support the practice of new recovery tools. Encouragement of attendance at 12 step recovery meetings, identification of triggers for use/ relapse and the development of a supportive social network were also covered.    Plan: Abstinence, take suboxone as prescribed, attend at least 4 NA/AA meetings a week and return to clinic in two weeks.    Gavin Potters, Loreli Slot, PhD  Associate  Professor  Department of Behavioral Medicine and Psychiatry

## 2022-05-14 LAB — ETHYL GLUCURONIDE & ETHYL SULFATE (ALCOHOL METABOLITES),URINE
CREATININE RANDOM URINE: 278 mg/dL — ABNORMAL HIGH (ref 50–100)
ETHYL GLUCURONIDE: NEGATIVE
ETHYL SULFATE: NEGATIVE

## 2022-05-14 LAB — ETHYL GLUCURONIDE AND ETHYL SULFATE, URINE
ETHYL GLUCURONIDE: NEGATIVE
ETHYL SULFATE: NEGATIVE

## 2022-05-14 LAB — SUBOXONE CONFIRMATORY/DEFINITIVE, URINE, BY LC-MS/MS (PERFORMABLE)
BUPRENORPHINE: 566 ng/mL — ABNORMAL HIGH (ref <10)
NALOXONE: >1000 ng/mL — ABNORMAL HIGH (ref <5)
NORBUPRENORPHINE: >1000 ng/mL — ABNORMAL HIGH (ref <10)

## 2022-05-14 LAB — FENTANYL CONFIRMATORY/DEFINITIVE, URINE, BY LC-MS/MS (PERFORMABLE)
FENTANYL INTERPRETATION: NEGATIVE
FENTANYL: NOT DETECTED ng/mL (ref <0.5)
NORFENTANYL: NOT DETECTED ng/mL (ref <2)

## 2022-05-15 LAB — DRUG MONITORING, GABAPENTIN, QUANTITATIVE, URINE: GABAPENTIN: NEGATIVE ng/mL (ref <1000)

## 2022-05-15 LAB — NOTES AND COMMENTS

## 2022-05-23 ENCOUNTER — Other Ambulatory Visit: Payer: Self-pay

## 2022-05-23 ENCOUNTER — Ambulatory Visit (HOSPITAL_COMMUNITY): Payer: MEDICAID | Admitting: Clinical

## 2022-05-23 ENCOUNTER — Telehealth (HOSPITAL_COMMUNITY): Payer: MEDICAID | Admitting: Psychiatry

## 2022-05-23 DIAGNOSIS — F1120 Opioid dependence, uncomplicated: Secondary | ICD-10-CM

## 2022-05-23 MED ORDER — BUPRENORPHINE 8 MG-NALOXONE 2 MG SUBLINGUAL FILM
1.0000 | ORAL_FILM | Freq: Two times a day (BID) | SUBLINGUAL | 0 refills | Status: DC
Start: 2022-05-23 — End: 2022-06-08

## 2022-05-23 NOTE — Progress Notes (Signed)
COAT TELE-Clinic Progress Note  TELEMEDICINE DOCUMENTATION:    Patient Location:  [place] [address]   Patient/family aware of provider location:  YES  Patient/family consent for telemedicine:  YES    Due to COVID-19 concerns patient presents for virtual follow-up to the [facility]. The group modality was facilitated via Zoom.     The patient was seen as part of a collaborative telemedicine service with their COAT treatment team (case mgr, therapist, prescriber) who participated in the encounter by active presence via approved video/audio means for portions of the encounter.  This clinic visit included medication management, addiction education, and evaluation of progress in recovery.     This visit included extensive discussion regarding proper etiquette for use of tele in a group based format.    Examination observed and performed by:  Nikki Dom. Gwenlyn Found, [facility] Tele-Video-Audio Progress Note  Patient Name:  Cheryl [name]  MRN: [id number]  Date: 05/23/2022   Chief Complaint: "Addiction"  Current MAT: Buprenorphine   16 mg  Subjective:    Patient has been sober for 250 days.   "Doing great!" Working. Watching [employer]. Did 3 meetings. Looking for a place to live in [place]. Will pay court fines next week to pay probation off.   Cravings-None   Withdrawal Symptoms- None  Side Effects-None    Motivation is good    General Exam:  Patient is in no acute distress  alert and oriented      Mental Status:  Speech is clear and coherent.  Thought process is linear.  No indication of responding to internal stimuli.  Affect is stable  Attention is sustained  Concentration is sustained  Insight is good    Assessment:  Opioid Dependence 304.9 - Stable        Plan:  Continue COAT Clinic and medication.  [id number]  Coordination of Care:  Coordination of Care:  Patient was seen in the [facility] in the Department of Behavioral Medicine and Psychiatry at [facility] in [place], [place]. This clinic  visit included medication management, addiction education, and evaluation of progress in recovery in a group setting. Patient's progress, psychosocial functioning and treatment response was discussed during the treatment team meeting with the case manager and/or therapist to assist in medical decision-making.    Luetta Nutting, DO 05/23/2022

## 2022-05-30 ENCOUNTER — Encounter (INDEPENDENT_AMBULATORY_CARE_PROVIDER_SITE_OTHER): Payer: Self-pay

## 2022-05-30 ENCOUNTER — Telehealth (INDEPENDENT_AMBULATORY_CARE_PROVIDER_SITE_OTHER): Payer: MEDICAID

## 2022-05-30 DIAGNOSIS — L309 Dermatitis, unspecified: Secondary | ICD-10-CM

## 2022-05-30 MED ORDER — NYSTATIN 100,000 UNIT/GRAM TOPICAL CREAM
TOPICAL_CREAM | Freq: Two times a day (BID) | CUTANEOUS | 0 refills | Status: AC
Start: 2022-05-30 — End: 2022-06-13

## 2022-05-30 NOTE — Patient Instructions (Signed)
[facility]      Operated by [facility]  [address] [place], [place] [address]  Phone: [phone])  Fax: [phone]  [email or web address]  Open Daily 8:00a - 8:00p    Closed Thanksgiving and Christmas Day  [facility]     Operated by [facility]  [place],  [address]  Phone: [phone])  Fax: [phone]  [email or web address]  Open M-F  8:00a - 8:00p  Sat              10:00a - 4:00p  Sun             Closed  Closed all [place] Holidays        Attending Caregiver: Johnell Comings, APRN,FNP-BC      Today's orders:   Orders Placed This Encounter    nystatin (MYCOSTATIN) 100,000 unit/gram Cream        Prescription(s) E-Rx to:  [facility] [id number] - [place], [place]    ________________________________________________________________________  Short Term Disability and [facility] does NOT provide assistance with any disability applications.  If you feel your medical condition requires you to be on disability, you will need to follow up with  your primary care physician or a specialist.  We apologize for any inconvenience.    For Medication Prescribed by [facility]:  As an Urgent Care facility, our clinic does NOT offer prescription refills over the telephone.    If you need more of the medication one of our medical providers prescribed, you will  either need to be re-evaluated by [place] or see your primary care physician.    ________________________________________________________________________      It is very important that we have a phone number.  This is the single best way to contact you in the event that we become aware of important clinical information or concerns after your discharge.  If the phone number you provided at registration is NOT this number you should inform staff and registration prior to  leaving.      Your treatment and evaluation today was focused on identifying and treating potentially emergent conditions based on your presenting signs, symptoms, and history.  The resulting initial clinical impression and treatment plan is not intended to be definitive or a substitute for a full physical examination and evaluation by your primary care provider.  If your symptoms persist, worsen, or you develop any new or concerning symptoms, you need to be evaluated.      If you received x-rays during your visit, be aware that the final and formal interpretation of those films by a radiologist may occur after your discharge.  If there is a significant discrepancy identified after your discharge, we will contact you at the telephone number provided at registration.      If you received a pelvic exam, you may have cultures pending for sexually transmitted diseases.  Positive cultures are reported to the [place] Department of Health as required by state law.  You may contact the Health Information Management Office of [facility] to get a copy of your results.     If you are over 78 year old, we cannot discuss your personal health information with a parent, spouse, family member, or anyone else without your consent.  This does not include those who have legitimate access to your records and information  to assist in your care under the provisions of HIPAA ([place]) law, or those to whom you have previously given written consent to do so, such a legal guardian or Power of [place].      Instructions are discussed with patient upon discharge by clinical staff with all questions answered.  Please call [facility] [phone]) if any further questions develop.  Go immediately to the emergency department if any concerns or worsening symptoms.    Use the nystatin as prescribed.    Follow-up in 2 weeks if your rash is not improving.    Keep the area under your breasts  nice and dry to help air the area and decrease the infection.  Johnell Comings, APRN,FNP-BC 05/30/2022, 10:45

## 2022-05-30 NOTE — Progress Notes (Signed)
URGENT CARE, [facility]  [place] [address]    Video Visit     Name: Cheryl [name]  MRN: [id number]    Date: 05/30/2022  Age: 37 y.o.                            Patient's location: [place] [address]   Patient/family aware of provider location: Yes  Patient/family consent for video visit: Yes  Interview and observation performed by: Johnell Comings, APRN,FNP-BC    Chief Complaint: Skin Problem    History of Present Illness:  Cheryl [name] is a 37 y.o. female with complaints of a rash under her breast for 1 month.  The patient reports that she has had a similar infection before and was given nystatin but does not have any.  She is asking if she can get Diastat.  Patient reports that is irritated but not itchy.  She states she does sweat a lot underneath the breast.  No systemic signs of infection.    Past Medical History:  She has no past medical history on file.    Past Surgical History:  She has a past surgical history that includes hx cesarean section and Ovary surgery.    Problem List:  She does not have a problem list on file.    Medications:    Benzonatate    buprenorphine-naloxone    busPIRone    citalopram    cloNIDine HCL    lidocaine    Methylprednisolone    nystatin    predniSONE    QUEtiapine     Review of Systems:  +rash under breast    Observational Exam:   Alert and oriented times four.   No acute distress.   Breathing is even and unlabored.     Data Reviewed:   See Epic    Assessment/Plan:        ICD-10-CM    1. Dermatitis  L30.9         Orders Placed This Encounter    nystatin (MYCOSTATIN) 100,000 unit/gram Cream   Instructed patient use denies status prescribed.    Follow-up in 2 weeks if rash is not improving.  Keep the area under the breast clean and dry to help prevent irritation.    During the video visit the video kept freezing up so I was not able to view patient's rash due to this.  However given what the patient was describing  it sounded as though she is having a dermatitis related to moisture and possible fungal infection.  So I decided to go ahead and treat her with an antifungal cream.    Follow Up:  2 weeks if no improvement.     Johnell Comings, APRN,FNP-BC  05/30/2022  10:47

## 2022-06-06 ENCOUNTER — Telehealth: Payer: MEDICAID | Admitting: Psychiatry

## 2022-06-06 ENCOUNTER — Other Ambulatory Visit: Payer: Self-pay

## 2022-06-06 ENCOUNTER — Telehealth: Payer: MEDICAID | Admitting: Clinical

## 2022-06-06 DIAGNOSIS — F1190 Opioid use, unspecified, uncomplicated: Secondary | ICD-10-CM

## 2022-06-06 DIAGNOSIS — F1120 Opioid dependence, uncomplicated: Secondary | ICD-10-CM

## 2022-06-06 DIAGNOSIS — Z79899 Other long term (current) drug therapy: Secondary | ICD-10-CM

## 2022-06-06 NOTE — Progress Notes (Signed)
COAT TELE-Clinic Progress Note  TELEMEDICINE DOCUMENTATION:    Patient Location:  [place] [address]   Patient/family aware of provider location:  YES  Patient/family consent for telemedicine:  YES    Due to COVID-19 concerns patient presents for virtual follow-up to the [facility]. The group modality was facilitated via Zoom.     The patient was seen as part of a collaborative telemedicine service with their COAT treatment team (case mgr, therapist, prescriber) who participated in the encounter by active presence via approved video/audio means for portions of the encounter.  This clinic visit included medication management, addiction education, and evaluation of progress in recovery.     This visit included extensive discussion regarding proper etiquette for use of tele in a group based format.    Examination observed and performed by:  Nikki Dom. Gwenlyn Found, [facility] Tele-Video-Audio Progress Note  Patient Name:  Cheryl [name]  MRN: [id number]  Date: 06/06/2022   Chief Complaint: "Addiction"  Current MAT: Buprenorphine   16 mg  Subjective:    Patient has been sober for 264 days.   Spoke with PO who is allowing her to move back to [facility].   Went to several meetings back home.   Cravings-None   Withdrawal Symptoms- None  Side Effects-None    Motivation is good    General Exam:  Patient is in no acute distress  alert and oriented      Mental Status:  Speech is clear and coherent.  Thought process is linear.  No indication of responding to internal stimuli.  Affect is stable  Attention is sustained  Concentration is sustained  Insight is good    Assessment:  Opioid Dependence 304.9 - Stable        Plan:  Continue COAT Clinic and medication.  [id number]  Coordination of Care:  Coordination of Care:  Patient was seen in the [facility] in the Department of Behavioral Medicine and Psychiatry at [facility] in [place], [place]. This clinic visit included medication management, addiction  education, and evaluation of progress in recovery in a group setting. Patient's progress, psychosocial functioning and treatment response was discussed during the treatment team meeting with the case manager and/or therapist to assist in medical decision-making.    Luetta Nutting, DO 06/06/2022

## 2022-06-08 ENCOUNTER — Other Ambulatory Visit (HOSPITAL_COMMUNITY): Payer: Self-pay | Admitting: Psychiatry

## 2022-06-08 ENCOUNTER — Ambulatory Visit (HOSPITAL_COMMUNITY): Payer: Self-pay | Admitting: Student in an Organized Health Care Education/Training Program

## 2022-06-08 ENCOUNTER — Other Ambulatory Visit (HOSPITAL_COMMUNITY): Payer: Self-pay | Admitting: Student in an Organized Health Care Education/Training Program

## 2022-06-08 DIAGNOSIS — F1120 Opioid dependence, uncomplicated: Secondary | ICD-10-CM

## 2022-06-08 MED ORDER — BUPRENORPHINE 8 MG-NALOXONE 2 MG SUBLINGUAL FILM
1.0000 | ORAL_FILM | Freq: Two times a day (BID) | SUBLINGUAL | 0 refills | Status: DC
Start: 2022-06-08 — End: 2022-06-20

## 2022-06-08 NOTE — Telephone Encounter (Addendum)
Called Dr. Gwenlyn Found to confirm patient was okay to get new prescription, sent it in to the [facility] in [place].    Margret Chance, M.D., 06/08/2022, 16:35  PGY-3, Behavioral Medicine and Psychiatry  [facility]     ----- Message from [place] sent at 06/08/2022  4:27 PM EST -----  Pharmacist called for a refill of Suboxone. Dr. Margret Chance stated at 3:36, she would speak to pharmacy after speaking to other staff. ck

## 2022-06-11 ENCOUNTER — Encounter (HOSPITAL_COMMUNITY): Payer: Self-pay

## 2022-06-11 NOTE — Progress Notes (Signed)
[place] Controlled Substance Full Name Report Report Date 06/11/2022   From 03/12/2022 To 06/10/2022 Date of Birth April 05, 1986 Prescription Count 7   Last Name [place] First Name [place] Name                                      Patients included in report that appear to match the search criteria.   Last Name First Name Middle Name Gender Address   [place] , [place], [place], [address]            Prescriber Name Prescriber [place] Name [place] Zip Rx Written Date Rx Dispense Date  & Date Sold    Rx Number Product Name MEDD Status Strength Qty Days # of Refill Sched Payment Type   Otillie Lomeli [address] [place], [place], [place], [place]) [id number] [place]. [id number] 06/08/2022 06/08/2022 06/08/2022 [id number] Suboxone Film ACTIVE 8 mg/1; 2 mg/'1 28 14 '$ 0/0 CIII Medicaid   Luetta Nutting [id number] [place]. [id number] 05/23/2022 05/23/2022 05/23/2022 [id number] Suboxone Film INACTIVE 8 mg/1; 2 mg/'1 28 14 '$ 0/0 CIII Medicaid   Luetta Nutting [id number] [place]. [id number] 05/09/2022 05/09/2022 05/09/2022 [id number] Suboxone Film INACTIVE 8 mg/1; 2 mg/'1 28 14 '$ 0/0 CIII Medicaid   Luetta Nutting [id number] [place] [id number] 04/25/2022 04/25/2022 04/26/2022 [id number] Suboxone Film INACTIVE 8 mg/1; 2 mg/'1 28 14 '$ 0/0 CIII Medicaid   Luetta Nutting [id number] [place]. [id number] 04/11/2022 04/11/2022 04/12/2022 [id number] Suboxone Film INACTIVE 8 mg/1; 2 mg/'1 28 14 '$ 0/0 CIII Medicaid   Luetta Nutting [id number] [place] [id number] 03/28/2022 03/28/2022 03/28/2022 [id number] Suboxone Film INACTIVE 8 mg/1; 2 mg/'1 28 14 '$ 0/0 CIII Medicaid   Elvia Collum [id number]  [place]. [id number] 03/14/2022 03/14/2022 03/14/2022 [id number] Suboxone Film INACTIVE 8 mg/1; 2 mg/'1 28 14 '$ 0/0 CIII Medicaid            Sela Hua, CASE MANAGER

## 2022-06-13 NOTE — Progress Notes (Signed)
COAT GROUP BI-WEEKLY PROGRESS NOTE    GROUP THERAPY    NAME: Juliann Pulse  Date of Service: 06/06/2022  TIME IN: 2:30  TIME OUT: 3:30  CPT CODE: 63817, [phone]  CHIEF COMPLAINT: Group Therapy for Addiction  NUMBER IN GROUP: 8    TELEMEDICINE DOCUMENTATION:    Patient Location:  MyChart video visit from home address: [address]  [place] [address]    Patient/family aware of provider location:  yes  Patient/family consent for telemedicine:  yes  Examination observed and performed by:   Gavin Potters, LICSW      Subjective: Patient seen for group therapy.  Patient has over 90 days without the use of substances and has attended required meetings. Provided psycho education to group member on identifying high risk situations for relapse and looking at times when members were confronted by these examples. Explored when they were successful in avoiding relapse and what coping skills were utilized. Discussed times when relapse occurred and what skills could have been implemented.     Observation:  Mood: within normal limits  Affect: congruent to mood and normal range   Participation: Active participant    Assessment/Diagnosis: Opioid Use Disorder, with dependence, on maintenance therapy (F11.20)    Procedure: Patient attended the intermediate bi-weekly COAT Group. The focus of this group to educate about the process of  building a sound recovery program, to facilitate the understanding of the disease of addiction and how Suboxone works to aid in the development of recovery, and to support the practice of new recovery tools. Encouragement of attendance at 12 step recovery meetings, identification of triggers for use/ relapse and the development of a supportive social network were also covered.    Plan: Abstinence, take suboxone as prescribed, attend at least 4 NA/AA meetings a week and return to clinic in two weeks.    Gavin Potters, Loreli Slot, PhD  Associate Professor  Department of Behavioral Medicine and Psychiatry

## 2022-06-20 ENCOUNTER — Other Ambulatory Visit: Payer: Self-pay

## 2022-06-20 ENCOUNTER — Telehealth: Payer: MEDICAID | Admitting: Clinical

## 2022-06-20 ENCOUNTER — Telehealth: Payer: MEDICAID | Admitting: Family

## 2022-06-20 DIAGNOSIS — F1120 Opioid dependence, uncomplicated: Secondary | ICD-10-CM

## 2022-06-20 MED ORDER — BUPRENORPHINE 8 MG-NALOXONE 2 MG SUBLINGUAL FILM
1.0000 | ORAL_FILM | Freq: Two times a day (BID) | SUBLINGUAL | 0 refills | Status: DC
Start: 2022-06-20 — End: 2022-07-03

## 2022-06-20 NOTE — Progress Notes (Signed)
COAT GROUP BI-WEEKLY PROGRESS NOTE      GROUP THERAPY                       Patient's Name: TOPAZ ELDRETH  Date of Service: 06/20/2022  Duration of group:  60 minutes    Procedure Code: [id number]    Chief Complaint:  Group Therapy for Addiction    Time: 2:30 am - 3:30 pm    Number of people present in group: 6    Patient reports number of days of abstinence:  278      TELEMEDICINE DOCUMENTATION:      Patient Location: Home address via Zoom  Patient/family aware of provider location:  Yes   Patient/family consent for telemedicine:  Yes  Examination observed and performed by:  Tanja Port, PhD, LICSW    Subjective: Patient seen for group therapy.        "Prudencio Pair" are extremely important in our lives. "Angels" play such a big role in who we become, in the perspectives that we have about life, in the values that we embrace, in the struggles that we endure, and in the hopes and dreams that we aspire to achieve. In honor of that special someone who is always there for [place], the group session today invited members to share about their "angel" their someone special who has always there for them.   RAKELLE NONG shared that her 99 year old son Dorothea Ogle, who her mother adopted, is her angel along with her 67 year old son, Oswaldo Milian, who was adopted by another family. She says her son keeps her wanting to do better in life and helps her to stay strong. She is looking forward to seeing him grow up and get married.    We also played the song, "You Say" by Phill Mutter. The song says that there may be times when they feel they are not loved, that they are alone, that no one understands what they are going through, that no one cares, and that their life is not worth fighting for but that is not true. There are "ANGELS" -- people who care, who love them, who will help them on this difficult path, and that their life is definitely worth fighting for.          Observation:  Mood: within normal limits  Affect: congruent to mood and  normal range   Participation: active participation  Behavior: cooperative    Assessment/Diagnosis: Opioid Use Disorder, with dependence, on maintenance therapy (F11.20)    Procedure: Patient attended the weekly COAT Group. The focus of this group is to educate about the process of building a sound recovery program, to facilitate the understanding of the disease of addiction and how Suboxone works to aid in the development of recovery, and to support the practice of new recovery tools. Encouragement of attendance at 12 step recovery meetings, identification of triggers for use/ relapse and the development of a supportive social network were also covered.     Plan: Abstinence, take suboxone as prescribed, attend at least 4 NA/AA meetings a week and return to clinic in two weeks.      Tanja Port, PhD, LICSW  Assistant Professor, Clinical Therapist  Department of Behavioral Medicine and Psychiatry

## 2022-06-20 NOTE — Progress Notes (Signed)
[facility]  Department of Behavioral Medicine Outpatient  Comprehensive Opioid Addiction Treatment (COAT) Program  Biweekly Group Progress Note    TELEMEDICINE DOCUMENTATION:    Patient Location:  Zoom Visit from [address]  [place] [address]  Patient/family aware of provider location:  Yes  Patient/family consent for telemedicine:  Yes  Examination observed and performed by: Burnard Bunting, APRN    Video Visit, via Zoom      Patient Name:  Cheryl [name]  MRN: [id number]  Date: 06/20/2022   Payor Source: Payor: [place] / Plan: [place] / Product Type: Medicaid MC /   Peer Recovery Support Specialist present during group: no    Chief Concern: "Follow up Med Check for Substance Use Disorder"    Current MOUD:  Buprenorphine Dose: 16 mg/daily       Subjective:    Patient reports 278 clinic days. She reports she has moved back to her hometown in [place], [place]. She is currently living with her mother, but hopes to find a house soon. She is also looknig for a job. She notes she is doing well otherwise and denies concerns.   Cravings: None  Withdrawal Symptoms: None  Side Effects: none    Motivation: Good      Objective:  Mood: within normal limits    Affect: stable     Speech: normal rate/ clear/ coherent    Thought process: Goal directed/Linear     Attention: maintained and interactive     Appearance: Alert, Receptive, Oriented , and No acute distress      Insight: Good      Urine drug screen:      07/10/2021     9:00 AM 08/31/2021     2:00 PM 10/05/2021     9:00 AM 11/27/2021    12:47 PM 12/28/2021     1:00 PM 02/15/2022     9:00 AM 05/09/2022    12:00 PM   Drug Screen Results   Amphetamine (AMP) Negative Negative Negative Negative Negative Negative Negative   Barbiturates (BAR) Negative Negative Negative Negative Negative Negative Negative   BUP - Cut off Levels 10 ng/ml Positive Positive Positive Positive Positive Positive Positive   Benzodiazepine (BZO) Negative Negative Negative  Negative Negative Negative Negative   Cocaine (COC) Negative Negative Negative Negative Negative Negative Negative   Methamphetamine (MET) Negative Negative Negative Negative Negative Negative Negative   Methadone (MTD) Negative Negative Negative Negative Negative Negative Negative   Oxycodone (OXY) Negative Negative Negative Negative Negative Negative Negative   Marijuana (THC) Negative Negative Negative Negative Negative Negative Negative   Ecstasy (MDMA) Negative Negative Negative Negative Negative Negative Negative   Phencyclidine (PCP) Negative Negative Negative Negative Negative Negative Negative   Tricyclic Antidepressant (TCA)   Negative Negative Negative Negative Negative   Temperature within range? yes yes yes yes yes yes yes   Observed no yes no no no yes no   Tester Martie Round Jk AK Ciara Ciara Ciara   Physician [address] [place]   Lot # [id number] [phone] [id number] [phone] [id number] [phone] [id number]   Expiration Date 06/20/2022 06/20/2022 04/27/2023 04/27/23 04/20/2023 04/20/2023 09/04/2023   Internal Control Valid yes yes yes yes yes yes yes   Initials CND CND Jk ak CND CND CND       Assessment:  Opioid Use Disorder, severe, on maintenance therapy     Other SUD Diagnoses: None  Procedure: Patient was seen in  the COAT Clinic in the Department of [place] and Psychiatry in [place], [place]. This clinic visit included medication management, addiction education, and evaluation of progress in recovery in a group setting. Patient's progress, psychosocial functioning and treatment response was discussed during the treatment team meeting with the case manager and/or therapist to assist in medical decision-making.   Plan:  Return for next scheduled appointment in [place] recovery work, including peer recovery meetings.  Continue current MOUD 16 mg/daily .    Burnard Bunting, APRN  06/20/2022, 14:36

## 2022-07-03 ENCOUNTER — Other Ambulatory Visit (INDEPENDENT_AMBULATORY_CARE_PROVIDER_SITE_OTHER): Payer: Self-pay | Admitting: Psychiatry

## 2022-07-03 ENCOUNTER — Encounter (INDEPENDENT_AMBULATORY_CARE_PROVIDER_SITE_OTHER): Payer: Self-pay

## 2022-07-03 DIAGNOSIS — F1120 Opioid dependence, uncomplicated: Secondary | ICD-10-CM

## 2022-07-03 DIAGNOSIS — F1190 Opioid use, unspecified, uncomplicated: Secondary | ICD-10-CM

## 2022-07-03 NOTE — Telephone Encounter (Addendum)
CM attempted to return call. No answer and unable to leave a message as mailbox is full.     CM did send patient a MyChart message with instructions regarding required urine drug screen for this week.     Cleora Fleet, CT        ----- Message from Charlett Blake sent at 07/03/2022 11:31 AM EDT -----  Pt called back after missing your call and is asking if you could please call her back. She has a question. Thank you, Kenney Houseman

## 2022-07-03 NOTE — Progress Notes (Signed)
[place]  Department of Behavioral Medicine Outpatient  Comprehensive Opioid Addiction Treatment (COAT) Program  Biweekly Group Progress Note    TELEMEDICINE DOCUMENTATION:    Patient Location:  Zoom Visit from [place] [address]  Patient/family aware of provider location:  Yes  Patient/family consent for telemedicine:  Yes  Examination observed and performed by: Elvia Collum, MD    Video Visit, via Zoom      Patient Name:  Cheryl [name]  MRN: [id number]  Date: 07/04/2022   Payor Source: Payor: [place] / Plan: [place] / Product Type: Medicaid MC /   Peer Recovery Support Specialist present during group: no    Chief Concern: "Follow up Med Check for Substance Use Disorder"    Current MOUD:  Buprenorphine Dose: 16 mg/daily       Subjective:    Patient reports 292 clinic days.  Doing well, will go to local hospital to complete her screen.  No issues with sobriety or medications, mood is good.  Cravings: None  Withdrawal Symptoms: None  Side Effects: none    Motivation: Good      Objective:  Mood: within normal limits    Affect: stable     Speech: normal rate/ clear/ coherent    Thought process: Goal directed/Linear     Attention: maintained and interactive     Appearance: Alert, Receptive, Oriented , and No acute distress      Insight: Good      Urine drug screen:      07/10/2021     9:00 AM 08/31/2021     2:00 PM 10/05/2021     9:00 AM 11/27/2021    12:47 PM 12/28/2021     1:00 PM 02/15/2022     9:00 AM 05/09/2022    12:00 PM   Drug Screen Results   Amphetamine (AMP) Negative Negative Negative Negative Negative Negative Negative   Barbiturates (BAR) Negative Negative Negative Negative Negative Negative Negative   BUP - Cut off Levels 10 ng/ml Positive Positive Positive Positive Positive Positive Positive   Benzodiazepine (BZO) Negative Negative Negative Negative Negative Negative Negative   Cocaine (COC) Negative Negative Negative Negative Negative Negative Negative    Methamphetamine (MET) Negative Negative Negative Negative Negative Negative Negative   Methadone (MTD) Negative Negative Negative Negative Negative Negative Negative   Oxycodone (OXY) Negative Negative Negative Negative Negative Negative Negative   Marijuana (THC) Negative Negative Negative Negative Negative Negative Negative   Ecstasy (MDMA) Negative Negative Negative Negative Negative Negative Negative   Phencyclidine (PCP) Negative Negative Negative Negative Negative Negative Negative   Tricyclic Antidepressant (TCA)   Negative Negative Negative Negative Negative   Temperature within range? yes yes yes yes yes yes yes   Observed no yes no no no yes no   Tester Martie Round Jk AK Ciara Ciara Ciara   Physician [address] [place]   Lot # [id number] [phone] [id number] [phone] [id number] [phone] [id number]   Expiration Date 06/20/2022 06/20/2022 04/27/2023 04/27/23 04/20/2023 04/20/2023 09/04/2023   Internal Control Valid yes yes yes yes yes yes yes   Initials CND CND Jk ak CND CND CND       Assessment:  Opioid Use Disorder, severe, on maintenance therapy     Other SUD Diagnoses: None  Procedure: Patient was seen in the [facility] in the Department of Behavioral Medicine and Psychiatry in [place], [place]. This clinic visit included medication management, addiction education, and  evaluation of progress in recovery in a group setting. Patient's progress, psychosocial functioning and treatment response was discussed during the treatment team meeting with the case manager and/or therapist to assist in medical decision-making.   Plan:  Return for next scheduled appointment in [place] recovery work, including peer recovery meetings.  Continue current MOUD 16 mg/daily .    Elvia Collum, MD

## 2022-07-03 NOTE — Telephone Encounter (Signed)
Person was notified by MyChart only as vm was full  to present to a [facility] by EOD on 07/05/22 for OBSERVED (unless absolutely not possible)  urine drug screen.  Pt to be sent out for ETGR, GABA , BUP, and FENT        , CT  07/03/2022, 11:14

## 2022-07-04 ENCOUNTER — Ambulatory Visit (HOSPITAL_COMMUNITY): Payer: MEDICAID | Admitting: Clinical

## 2022-07-04 ENCOUNTER — Other Ambulatory Visit: Payer: Self-pay

## 2022-07-04 ENCOUNTER — Telehealth: Payer: MEDICAID | Admitting: Emergency Medicine

## 2022-07-04 DIAGNOSIS — F1120 Opioid dependence, uncomplicated: Secondary | ICD-10-CM

## 2022-07-04 MED ORDER — BUPRENORPHINE 8 MG-NALOXONE 2 MG SUBLINGUAL FILM
1.0000 | ORAL_FILM | Freq: Two times a day (BID) | SUBLINGUAL | 0 refills | Status: DC
Start: 2022-07-04 — End: 2022-07-18

## 2022-07-05 ENCOUNTER — Other Ambulatory Visit: Payer: MEDICAID | Attending: Psychiatry

## 2022-07-05 DIAGNOSIS — F1190 Opioid use, unspecified, uncomplicated: Secondary | ICD-10-CM | POA: Insufficient documentation

## 2022-07-06 LAB — CREATININE URINE, RANDOM: CREATININE RANDOM URINE: 70 mg/dL (ref 50–100)

## 2022-07-07 LAB — DRUG MONITORING, GABAPENTIN, QUANTITATIVE, URINE: GABAPENTIN: NEGATIVE ng/mL (ref <1000)

## 2022-07-07 LAB — NOTES AND COMMENTS

## 2022-07-09 LAB — ETHYL GLUCURONIDE & ETHYL SULFATE (ALCOHOL METABOLITES),URINE
CREATININE RANDOM URINE: 70 mg/dL (ref >=20)
ETHYL GLUCURONIDE: NEGATIVE
ETHYL SULFATE: NEGATIVE

## 2022-07-09 LAB — FENTANYL CONFIRMATORY/DEFINITIVE, URINE, BY LC-MS/MS (PERFORMABLE)
FENTANYL INTERPRETATION: NEGATIVE
FENTANYL: NOT DETECTED ng/mL (ref <0.5)
NORFENTANYL: NOT DETECTED ng/mL (ref <2)

## 2022-07-09 LAB — SUBOXONE CONFIRMATORY/DEFINITIVE, URINE, BY LC-MS/MS (PERFORMABLE)
BUPRENORPHINE: 198 ng/mL — ABNORMAL HIGH (ref <10)
NALOXONE: >1000 ng/mL — ABNORMAL HIGH (ref <5)
NORBUPRENORPHINE: 679 ng/mL — ABNORMAL HIGH (ref <10)

## 2022-07-09 LAB — ETHYL GLUCURONIDE AND ETHYL SULFATE, URINE
ETHYL GLUCURONIDE: NEGATIVE
ETHYL SULFATE: NEGATIVE

## 2022-07-12 ENCOUNTER — Encounter (HOSPITAL_COMMUNITY): Payer: Self-pay

## 2022-07-12 NOTE — Progress Notes (Signed)
[facility]  [place] Controlled Substance Full Name Report Report Date 07/12/2022   From 04/12/2022 To 07/11/2022 Date of Birth 05-16-1985 Prescription Count 6   Last Name [name] First Name Cheryl Middle Name                                      Patients included in report that appear to match the search criteria.   Last Name First Name Middle Name Gender Address   [name]   F [address] , [place], [place], [address]            Prescriber Name Prescriber DEA & Zip Dispenser Name Dispenser DEA & Zip Rx Written Date Rx Dispense Date  & Date Sold    Rx Number Product Name MEDD Status Strength Qty Days # of Refill Sched Payment Type   Consuelo Geralds [address], [place], [address]   [name] [id number]  [place]. [id number] [phone] 07/04/2022 07/04/2022 07/04/2022 [id number] Suboxone Film ACTIVE 8 mg/1; 2 mg/1 28 14  0/0 CIII Medicaid   [name], Fnp-Bc [id number] [place] [id number] [phone] 06/20/2022 06/20/2022 06/20/2022 [id number] Suboxone Film INACTIVE 8 mg/1; 2 mg/1 28 14  0/0 CIII Medicaid   Madilyn Hook [place], [place]) [id number] [phone] [place]. [id number] [phone] 06/08/2022 06/08/2022 06/08/2022 [id number] Suboxone Film INACTIVE 8 mg/1; 2 mg/1 28 14  0/0 CIII Medicaid   [name] [id number] [address] [employer] [id number] [phone] 05/23/2022 05/23/2022 05/23/2022 [id number] Suboxone Film INACTIVE 8 mg/1; 2 mg/1 28 14  0/0 CIII Medicaid   [name] [id number] [address] [employer] [id number] [phone] 05/09/2022 05/09/2022 05/09/2022 [id number] Suboxone Film INACTIVE 8 mg/1; 2 mg/1 28 14  0/0 CIII Medicaid   [name] [id number] [address] [employer] [id number] [phone] 04/25/2022 04/25/2022 04/26/2022 [id number] Suboxone Film INACTIVE 8 mg/1; 2 mg/1 28 14  0/0 CIII Medicaid            [name], CASE MANAGER

## 2022-07-18 ENCOUNTER — Other Ambulatory Visit: Payer: Self-pay

## 2022-07-18 ENCOUNTER — Telehealth: Payer: MEDICAID | Admitting: Psychiatry

## 2022-07-18 DIAGNOSIS — F1121 Opioid dependence, in remission: Secondary | ICD-10-CM

## 2022-07-18 DIAGNOSIS — F1120 Opioid dependence, uncomplicated: Secondary | ICD-10-CM

## 2022-07-18 MED ORDER — BUPRENORPHINE 8 MG-NALOXONE 2 MG SUBLINGUAL FILM
1.0000 | ORAL_FILM | Freq: Two times a day (BID) | SUBLINGUAL | 0 refills | Status: DC
Start: 2022-07-18 — End: 2022-07-31

## 2022-07-18 NOTE — Progress Notes (Addendum)
[facility]  Department of Behavioral Medicine Outpatient  Comprehensive Opioid Addiction Treatment (COAT) Program  Biweekly Group Progress Note    TELEMEDICINE DOCUMENTATION:    Patient Location:  Zoom Visit from home in [place], [place]  Patient/family aware of provider location:  Yes  Patient/family consent for telemedicine:  Yes  Examination observed and performed by: [name], DO    Video Visit, via Zoom      Patient Name:  Cheryl [name]  MRN: [id number]  Date: 07/18/2022   Payor Source: Payor: Monia Pouch [employer] - [place] / Plan: Monia [employer] BETTER HE[employer] - [place] / Product Type: Medicaid MC /   Peer Recovery Support Specialist present during group: no    Chief Concern: "Follow up Med Check for Substance Use Disorder"    Current MOUD:  Buprenorphine Dose: 16 mg/daily       Subjective:    Patient reports 306 clinic days. "I'm great!" Glad to be able to stay with COAT. Very happy to be with family and son. Found meetings near home.  Cravings: None  Withdrawal Symptoms: None  Side Effects: none    Motivation: Good      Objective:  Mood: within normal limits    Affect: stable     Speech: normal rate/ clear/ coherent    Thought process: Goal directed/Linear     Attention: maintained and interactive     Appearance: Alert, Receptive, Oriented , and No acute distress      Insight: Good      Urine drug screen:      07/10/2021     9:00 AM 08/31/2021     2:00 PM 10/05/2021     9:00 AM 11/27/2021    12:47 PM 12/28/2021     1:00 PM 02/15/2022     9:00 AM 05/09/2022    12:00 PM   Drug Screen Results   Amphetamine (AMP) Negative Negative Negative Negative Negative Negative Negative   Barbiturates (BAR) Negative Negative Negative Negative Negative Negative Negative   BUP - Cut off Levels 10 ng/ml Positive Positive Positive Positive Positive Positive Positive   Benzodiazepine (BZO) Negative Negative Negative Negative Negative Negative Negative   Cocaine (COC) Negative Negative Negative Negative Negative Negative Negative   Methamphetamine (MET)  Negative Negative Negative Negative Negative Negative Negative   Methadone (MTD) Negative Negative Negative Negative Negative Negative Negative   Oxycodone (OXY) Negative Negative Negative Negative Negative Negative Negative   Marijuana (THC) Negative Negative Negative Negative Negative Negative Negative   Ecstasy (MDMA) Negative Negative Negative Negative Negative Negative Negative   Phencyclidine (PCP) Negative Negative Negative Negative Negative Negative Negative   Tricyclic Antidepressant (TCA)   Negative Negative Negative Negative Negative   Temperature within range? yes yes yes yes yes yes yes   Observed no yes no no no yes no   Tester Lennox Pippins Jk AK Ciara Ciara Ciara   Physician [address] [employer]   Lot # [id number] [phone] [id number] [phone] [id number] [phone] [id number]   Expiration Date 06/20/2022 06/20/2022 04/27/2023 04/27/23 04/20/2023 04/20/2023 09/04/2023   Internal Control Valid yes yes yes yes yes yes yes   Initials CND CND Jk ak CND CND CND       Assessment:  Opioid Use Disoder, severe, in early remission, on maintenance therapy     Other SUD Diagnoses: None  Procedure: Patient was seen in the COAT Clinic in the Department of Behavioral Medicine and Psychiatry in [place], [place]. This clinic visit included medication management, addiction education,  and evaluation of progress in recovery in a group setting. Patient's progress, psychosocial functioning and treatment response was discussed during the treatment team meeting with the case manager and/or therapist to assist in medical decision-making.   Plan:  Return for next scheduled appointment in COAT Clinic  Continue recovery work, including peer recovery meetings.  Continue current MOUD 16 mg/daily .    [name], DO

## 2022-07-31 NOTE — Progress Notes (Addendum)
[facility]  Department of Behavioral Medicine Outpatient  Comprehensive Opioid Addiction Treatment (COAT) Program  Biweekly Group Progress Note    TELEMEDICINE DOCUMENTATION:    Patient Location:  Zoom Visit from home in [place], [place]  Patient/family aware of provider location:  Yes  Patient/family consent for telemedicine:  Yes  Examination observed and performed by: [name], DO    Video Visit, via Zoom      Patient Name:  Cheryl [name]  MRN: [id number]  Date: 08/01/2022   Payor Source: Payor: Monia Pouch [employer]TER HE[employer] - [place] / Plan: Monia [employer] BETTER HE[employer] -  / Product Type: Medicaid MC /   Peer Recovery Support Specialist present during group: no    Chief Concern: "Follow up Med Check for Substance Use Disorder"    Current MOUD:  Buprenorphine Dose: 16 mg/daily       Subjective:    Patient reports 320 clinic days. 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 Excited to do this. 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 Doing 2 meetings a week. Looking for a sponsor. Has a couple of ladies in mind.  Nicotine: currently vapes and would like to quit. Does 5mg  vape.Tried 21mg  patch and got sick when vaped. Would like to try 7mg  patch.  Cravings: None  Withdrawal Symptoms: None  Side Effects: none    Motivation: Good      Objective:  Mood: within normal limits    Affect: stable     Speech: normal rate/ clear/ coherent    Thought process: Goal directed/Linear     Attention: maintained and interactive     Appearance: Alert, Receptive, Oriented , and No acute distress      Insight: Good      Urine drug screen:      07/10/2021     9:00 AM 08/31/2021     2:00 PM 10/05/2021     9:00 AM 11/27/2021    12:47 PM 12/28/2021     1:00 PM 02/15/2022     9:00 AM 05/09/2022    12:00 PM   Drug Screen Results   Amphetamine (AMP) Negative Negative Negative Negative Negative Negative Negative   Barbiturates (BAR) Negative Negative Negative Negative Negative Negative Negative   BUP - Cut off Levels 10 ng/ml Positive Positive Positive Positive Positive Positive Positive    Benzodiazepine (BZO) Negative Negative Negative Negative Negative Negative Negative   Cocaine (COC) Negative Negative Negative Negative Negative Negative Negative   Methamphetamine (MET) Negative Negative Negative Negative Negative Negative Negative   Methadone (MTD) Negative Negative Negative Negative Negative Negative Negative   Oxycodone (OXY) Negative Negative Negative Negative Negative Negative Negative   Marijuana (THC) Negative Negative Negative Negative Negative Negative Negative   Ecstasy (MDMA) Negative Negative Negative Negative Negative Negative Negative   Phencyclidine (PCP) Negative Negative Negative Negative Negative Negative Negative   Tricyclic Antidepressant (TCA)   Negative Negative Negative Negative Negative   Temperature within range? yes yes yes yes yes yes yes   Observed no yes no no no yes no   Tester Lennox Pippins Jk AK Ciara Ciara Ciara   Physician [address] [employer]   Lot # [id number] [phone] [id number] [phone] [id number] [phone] [id number]   Expiration Date 06/20/2022 06/20/2022 04/27/2023 04/27/23 04/20/2023 04/20/2023 09/04/2023   Internal Control Valid yes yes yes yes yes yes yes   Initials CND CND Jk ak CND CND CND       Assessment:  Opioid Use Disoder, severe, in early remission, on maintenance therapy  Other SUD Diagnoses: Tobacco Use Disorder  Procedure: Patient was seen in the COAT Clinic in the Department of Behavioral Medicine and Psychiatry in [place], [place]. This clinic visit included medication management, addiction education, and evaluation of progress in recovery in a group setting. Patient's progress, psychosocial functioning and treatment response was discussed during the treatment team meeting with the case manager and/or therapist to assist in medical decision-making.   Plan:  Return for next scheduled appointment in COAT Clinic  Continue recovery work, including peer recovery meetings.  Continue current MOUD 16 mg/daily .  7mg  NRT  patch.    [name], DO

## 2022-08-01 ENCOUNTER — Telehealth: Payer: MEDICAID | Admitting: Psychiatry

## 2022-08-01 ENCOUNTER — Telehealth: Payer: MEDICAID | Admitting: FAMILY PRACTICE

## 2022-08-01 ENCOUNTER — Other Ambulatory Visit: Payer: Self-pay

## 2022-08-01 DIAGNOSIS — F1120 Opioid dependence, uncomplicated: Secondary | ICD-10-CM

## 2022-08-01 DIAGNOSIS — F17200 Nicotine dependence, unspecified, uncomplicated: Secondary | ICD-10-CM

## 2022-08-01 DIAGNOSIS — F1121 Opioid dependence, in remission: Secondary | ICD-10-CM

## 2022-08-01 MED ORDER — NICOTINE 7 MG/24 HR DAILY TRANSDERMAL PATCH
7.0000 mg | MEDICATED_PATCH | Freq: Every day | TRANSDERMAL | 2 refills | Status: DC
Start: 2022-08-01 — End: 2023-08-20

## 2022-08-01 MED ORDER — BUPRENORPHINE 8 MG-NALOXONE 2 MG SUBLINGUAL FILM
1.0000 | ORAL_FILM | Freq: Two times a day (BID) | SUBLINGUAL | 0 refills | Status: DC
Start: 2022-08-01 — End: 2022-08-15

## 2022-08-02 NOTE — Progress Notes (Signed)
[facility]  Department of Behavioral Medicine Outpatient  Comprehensive Opioid Addiction Treatment (COAT) Program  Biweekly Group Therapy Note    TELEMEDICINE DOCUMENTATION:    Patient Location: Zoom Visit from [address]  [place] [address]   Patient/family aware of provider location:  Yes  Patient/family consent for telemedicine:  Yes  Examination observed and performed by: Maralyn Sago, [facility]      Video Visit, via Zoom     Patient Name:  Cheryl [name]  MRN: [id number]  Date: 08/01/2022   Payor Source: Payor: Monia Pouch BETTER HE[employer] - [place] / Plan: [employer] BETTER HEALTH - [place] / Product Type: Medicaid [place] /   CPT Code: [phone]  Time: 2:40 p.m. to 3:30 p.m.  Number in Group: 5  Peer Recovery Support Specialist present during group: no    Chief Concern: "Group Therapy for Substance Use Disorder"      Subjective:   Cheryl [name] presents today for group therapy through the Comprehensive Opioid Addiction Treatment (COAT) program.  She reports 320 clinic days. The purpose of this group was to discuss personal successes and struggles in recovery. She specifically discussed her new job at [employer].    Objective:   Mood: within normal limits  Affect: congruent to mood   Appearance/Behavior: Alert, Good eye contact, Good participation, Receptive, Supportive of peers, Oriented , Dressed appropriately , and No acute distress   Participation:  active and attentive      Assessment:  Opioid Use Disorder, severe, on maintenance therapy   Other SUD Diagnoses: None  Procedure:   This is a Biweekly  process and psychoeducational group designed to be appropriate to individuals with opioid use disorder in recovery. The focus of this group to educate about the process of building a sound recovery program, to facilitate the understanding of the disease of addiction and how Medication Assisted Treatment (MAT) works to aid in the development of recovery, and to support the practice of new recovery tools. Attendance and experience at peer  recovery meetings was reviewed. Patients were encouraged to identify triggers for relapse, employ relapse prevention strategies and to develop a supportive social network.    Plan:  Maintain abstinence, follow recommendations of Treatment Team, Continue attending the COAT Clinic group and take medication as prescribed. Attend the encouraged peer recovery meetings.    Maralyn Sago, LPC  08/02/2022, 14:56

## 2022-08-15 ENCOUNTER — Telehealth: Payer: MEDICAID | Admitting: Psychiatry

## 2022-08-15 ENCOUNTER — Telehealth: Payer: MEDICAID | Admitting: Clinical

## 2022-08-15 ENCOUNTER — Other Ambulatory Visit: Payer: Self-pay

## 2022-08-15 DIAGNOSIS — F17200 Nicotine dependence, unspecified, uncomplicated: Secondary | ICD-10-CM

## 2022-08-15 DIAGNOSIS — F1120 Opioid dependence, uncomplicated: Secondary | ICD-10-CM

## 2022-08-15 MED ORDER — BUPRENORPHINE 8 MG-NALOXONE 2 MG SUBLINGUAL FILM
1.0000 | ORAL_FILM | Freq: Two times a day (BID) | SUBLINGUAL | 0 refills | Status: DC
Start: 2022-08-15 — End: 2022-08-29

## 2022-08-15 NOTE — Progress Notes (Addendum)
[facility]  Department of Behavioral Medicine Outpatient  Comprehensive Opioid Addiction Treatment (COAT) Program  Biweekly Group Progress Note    TELEMEDICINE DOCUMENTATION:    Patient Location:  Zoom Visit from home in [place], [place]  Patient/family aware of provider location:  Yes  Patient/family consent for telemedicine:  Yes  Examination observed and performed by: [name], DO    Video Visit, via Zoom      Patient Name:  Cheryl [name]  MRN: [id number]  Date: 08/15/2022   Payor Source: Payor: Monia Pouch BETTER HE[employer] - [place] / Plan: Monia Pouch BETTER HEALTH - [place] / Product Type: Medicaid [place] /   Peer Recovery Support Specialist present during group: no    Chief Concern: "Follow up Med Check for Substance Use Disorder"    Current MOUD:  Buprenorphine Dose: 16 mg/daily       Subjective:    Patient reports 334 clinic days. Enjoying new job. Went to [employer].   Nicotine: Has not filled patch yet. Needs to call Quit line.  Cravings: None  Withdrawal Symptoms: None  Side Effects: none    Motivation: Good      Objective:  Mood: within normal limits    Affect: stable     Speech: normal rate/ clear/ coherent    Thought process: Goal directed/Linear     Attention: maintained and interactive     Appearance: Alert, Receptive, Oriented , and No acute distress      Insight: Good      Urine drug screen:      07/10/2021     9:00 AM 08/31/2021     2:00 PM 10/05/2021     9:00 AM 11/27/2021    12:47 PM 12/28/2021     1:00 PM 02/15/2022     9:00 AM 05/09/2022    12:00 PM   Drug Screen Results   Amphetamine (AMP) Negative Negative Negative Negative Negative Negative Negative   Barbiturates (BAR) Negative Negative Negative Negative Negative Negative Negative   BUP - Cut off Levels 10 ng/ml Positive Positive Positive Positive Positive Positive Positive   Benzodiazepine (BZO) Negative Negative Negative Negative Negative Negative Negative   Cocaine (COC) Negative Negative Negative Negative Negative Negative Negative   Methamphetamine (MET) Negative  Negative Negative Negative Negative Negative Negative   Methadone (MTD) Negative Negative Negative Negative Negative Negative Negative   Oxycodone (OXY) Negative Negative Negative Negative Negative Negative Negative   Marijuana (THC) Negative Negative Negative Negative Negative Negative Negative   Ecstasy (MDMA) Negative Negative Negative Negative Negative Negative Negative   Phencyclidine (PCP) Negative Negative Negative Negative Negative Negative Negative   Tricyclic Antidepressant (TCA)   Negative Negative Negative Negative Negative   Temperature within range? yes yes yes yes yes yes yes   Observed no yes no no no yes no   Tester Lennox Pippins Jk AK Ciara Ciara Ciara   Physician [address] [place] [employer]   Lot # [id number] [phone] [id number] [phone] [id number] [phone] [id number]   Expiration Date 06/20/2022 06/20/2022 04/27/2023 04/27/23 04/20/2023 04/20/2023 09/04/2023   Internal Control Valid yes yes yes yes yes yes yes   Initials CND CND Jk ak CND CND CND       Assessment:  Opioid Use Disoder, severe, in early remission, on maintenance therapy     Other SUD Diagnoses: Tobacco Use Disorder  Procedure: Patient was seen in the COAT Clinic in the Department of Behavioral Medicine and Psychiatry in [place], [place]. This clinic visit included medication management, addiction education,  and evaluation of progress in recovery in a group setting. Patient's progress, psychosocial functioning and treatment response was discussed during the treatment team meeting with the case manager and/or therapist to assist in medical decision-making.   Plan:  Return for next scheduled appointment in COAT Clinic  Continue recovery work, including peer recovery meetings.  Continue current MOUD 16 mg/daily .  Continue vaping cessation.    [name], DO

## 2022-08-15 NOTE — Addendum Note (Signed)
Addended by: [name] on: 08/15/2022 02:48 PM     Modules accepted: Level of Service

## 2022-08-16 ENCOUNTER — Encounter (HOSPITAL_COMMUNITY): Payer: Self-pay

## 2022-08-16 NOTE — Progress Notes (Signed)
[facility]  [place] Controlled Substance Full Name Report Report Date 08/16/2022   From 05/18/2022 To 08/15/2022 Date of Birth 07-Jan-1986 Prescription Count 7   Last Name Tovey First Name Storey Middle Name                                      Patients included in report that appear to match the search criteria.   Last Name First Name Middle Name Gender Address   [place]   F [address] , [place], [place], [address]            Prescriber Name Prescriber DEA & Zip Dispenser Name Dispenser DEA & Zip Rx Written Date Rx Dispense Date  & Date Sold    Rx Number Product Name MEDD Status Strength Qty Days # of Refill Sched Payment Type   Astacia Rusche [address], [place], [address]   [name] [id number] [facility] [id number] [phone] 08/15/2022 08/15/2022 08/15/2022 [id number] Suboxone Film ACTIVE 8 mg/1; 2 mg/1 28 14  0/0 CIII Medicaid   [name] [id number] [address] [employer] [employer] [id number] [phone] 08/01/2022 08/01/2022 08/01/2022 [id number] Suboxone Film INACTIVE 8 mg/1; 2 mg/1 28 14  0/0 CIII Medicaid   [name] [id number] [address] [employer] [id number] [phone] 07/18/2022 07/18/2022 07/18/2022 [id number] Suboxone Film INACTIVE 8 mg/1; 2 mg/1 28 14  0/0 CIII Medicaid   [name] [id number]  [place]. [id number] [phone] 07/04/2022 07/04/2022 07/04/2022 [id number] Suboxone Film INACTIVE 8 mg/1; 2 mg/1 28 14  0/0 CIII Medicaid   [name], Fnp-Bc [id number] [phone] [place] [id number] [phone] 06/20/2022 06/20/2022 06/20/2022 [id number] Suboxone Film INACTIVE 8 mg/1; 2 mg/1 28 14  0/0 CIII Medicaid   Madilyn Hook [place], [place]) [id number] [phone] [place]. [id number] [phone] 06/08/2022 06/08/2022 06/08/2022 [id number] Suboxone Film INACTIVE 8 mg/1; 2 mg/1 28 14  0/0 CIII Medicaid   [name] [id number] [address] [employer] [id number] [phone] 05/23/2022 05/23/2022 05/23/2022 [id number] Suboxone Film INACTIVE 8 mg/1; 2 mg/1 28 14  0/0 CIII Medicaid            [name], CASE MANAGER

## 2022-08-16 NOTE — Progress Notes (Signed)
[facility]  Department of Behavioral Medicine Outpatient  Comprehensive Opioid Addiction Treatment (COAT) Program  Biweekly Group Therapy Note    TELEMEDICINE DOCUMENTATION:    Patient Location: Zoom Visit from [address]  [place] [address]   Patient/family aware of provider location:  Yes  Patient/family consent for telemedicine:  Yes  Examination observed and performed by: Governor Specking, STUDENT SOCIAL WORKER      Video Visit, via Zoom     Patient Name:  Cheryl [name]  MRN: [id number]  Date: 08/15/2022   Payor Source: Payor: Monia Pouch BETTER HEALTH - [place] / Plan: A[employer] BETTER HEALTH - [place] / Product Type: Medicaid [place] /   CPT Code: [phone]  Time: 2:30-3:30  Number in Group: 3  Peer Recovery Support Specialist present during group: no    Chief Concern: "Group Therapy for Substance Use Disorder"      Subjective:   Cheryl [name] presents today for group therapy through the Comprehensive Opioid Addiction Treatment (COAT) program.  She reports 334 clinic days. The purpose of this group was to introduce new group members, practice mindfulness and explain it's benefits. She specifically discussed how the group has helped her and given her tools and strategies she can use outside of group.    Objective:   Mood: within normal limits  Affect: congruent to mood   Appearance/Behavior: Alert, Good eye contact, Good participation, Receptive, Supportive of peers, Oriented , Dressed appropriately , and No acute distress   Participation:  active and attentive      Assessment:  Opioid Use Disorder, severe, on maintenance therapy   Procedure:   This is a bi-weekly process and psychoeducational group designed to be appropriate to individuals with opioid use disorder in recovery. The focus of this group to educate about the process of building a sound recovery program, to facilitate the understanding of the disease of addiction and how Medication Assisted Treatment (MAT) works to aid in the development of recovery, and to support the practice of  new recovery tools. Attendance and experience at peer recovery meetings was reviewed. Patients were encouraged to identify triggers for relapse, employ relapse prevention strategies and to develop a supportive social network.    Plan:  Maintain abstinence, follow recommendations of Treatment Team, Continue attending the COAT Clinic group and take medication as prescribed. Attend the encouraged peer recovery meetings.    Governor Specking, STUDENT SOCIAL WORKER  08/16/2022, 11:21

## 2022-08-29 ENCOUNTER — Telehealth (HOSPITAL_COMMUNITY): Payer: MEDICAID | Admitting: Emergency Medicine

## 2022-08-29 ENCOUNTER — Telehealth: Payer: MEDICAID | Admitting: Clinical

## 2022-08-29 ENCOUNTER — Other Ambulatory Visit: Payer: Self-pay

## 2022-08-29 DIAGNOSIS — F1120 Opioid dependence, uncomplicated: Secondary | ICD-10-CM

## 2022-08-29 MED ORDER — BUPRENORPHINE 8 MG-NALOXONE 2 MG SUBLINGUAL FILM
1.0000 | ORAL_FILM | Freq: Two times a day (BID) | SUBLINGUAL | 0 refills | Status: DC
Start: 2022-08-29 — End: 2022-09-12

## 2022-08-29 NOTE — Progress Notes (Signed)
[facility]  Department of Behavioral Medicine Outpatient  Comprehensive Opioid Addiction Treatment (COAT) Program  Biweekly Group Progress Note    TELEMEDICINE DOCUMENTATION:    Patient Location:  Zoom Visit from home in [place], [place]  Patient/family aware of provider location:  Yes  Patient/family consent for telemedicine:  Yes  Examination observed and performed by: [name], MD    Video Visit, via Zoom      Patient Name:  Cheryl [name]  MRN: [id number]  Date: 08/29/2022   Payor Source: Payor: Monia Pouch BETTER HEALTH - [place] / Plan: Monia Pouch BETTER HEALTH - [place] / Product Type: Medicaid [place] /   Peer Recovery Support Specialist present during group: no    Chief Concern: "Follow up Med Check for Substance Use Disorder"    Current MOUD:  Buprenorphine Dose: 16 mg/daily       Subjective:    Patient reports 348 clinic days.  "Everything is going great", going to 2 meetings per week and likes them.  Enjoys her job at [employer] - everyone is happy when they are getting cookies.  Cravings: None  Withdrawal Symptoms: None  Side Effects: none    Motivation: Good      Objective:  Mood: within normal limits    Affect: stable     Speech: normal rate/ clear/ coherent    Thought process: Goal directed/Linear     Attention: maintained and interactive     Appearance: Alert, Receptive, Oriented , and No acute distress      Insight: Good      Urine drug screen:      07/10/2021     9:00 AM 08/31/2021     2:00 PM 10/05/2021     9:00 AM 11/27/2021    12:47 PM 12/28/2021     1:00 PM 02/15/2022     9:00 AM 05/09/2022    12:00 PM   Drug Screen Results   Amphetamine (AMP) Negative Negative Negative Negative Negative Negative Negative   Barbiturates (BAR) Negative Negative Negative Negative Negative Negative Negative   BUP - Cut off Levels 10 ng/ml Positive Positive Positive Positive Positive Positive Positive   Benzodiazepine (BZO) Negative Negative Negative Negative Negative Negative Negative   Cocaine (COC) Negative Negative Negative Negative  Negative Negative Negative   Methamphetamine (MET) Negative Negative Negative Negative Negative Negative Negative   Methadone (MTD) Negative Negative Negative Negative Negative Negative Negative   Oxycodone (OXY) Negative Negative Negative Negative Negative Negative Negative   Marijuana (THC) Negative Negative Negative Negative Negative Negative Negative   Ecstasy (MDMA) Negative Negative Negative Negative Negative Negative Negative   Phencyclidine (PCP) Negative Negative Negative Negative Negative Negative Negative   Tricyclic Antidepressant (TCA)   Negative Negative Negative Negative Negative   Temperature within range? yes yes yes yes yes yes yes   Observed no yes no no no yes no   Tester Lennox Pippins Jk AK Ciara Ciara Ciara   Physician [address] [place] [employer]   Lot # [id number] [phone] [id number] [phone] [id number]   Expiration Date 06/20/2022 06/20/2022 04/27/2023 04/27/23 04/20/2023 04/20/2023 09/04/2023   Internal Control Valid yes yes yes yes yes yes yes   Initials CND CND Jk ak CND CND CND       Assessment:  Opioid Use Disoder, severe, in early remission, on maintenance therapy     Other SUD Diagnoses: Tobacco Use Disorder  Procedure: Patient was seen in the COAT Clinic in the Department of Behavioral Medicine and Psychiatry in [place],  [place]. This clinic visit included medication management, addiction education, and evaluation of progress in recovery in a group setting. Patient's progress, psychosocial functioning and treatment response was discussed during the treatment team meeting with the case manager and/or therapist to assist in medical decision-making.   Plan:  Return for next scheduled appointment in COAT Clinic  Continue recovery work, including peer recovery meetings.  Continue current MOUD 16 mg/daily .  Continue vaping cessation.    [name], MD

## 2022-08-30 NOTE — Progress Notes (Signed)
[facility]  Department of Behavioral Medicine Outpatient  Comprehensive Opioid Addiction Treatment (COAT) Program  Biweekly Group Therapy Note    TELEMEDICINE DOCUMENTATION:    Patient Location: Zoom Visit from [address]  [place] [address]   Patient/family aware of provider location:  Yes  Patient/family consent for telemedicine:  Yes  Examination observed and performed by: Leroy Sea, PhD and observed by Governor Specking, STUDENT SOCIAL WORKER      Video Visit, via Zoom     Patient Name:  Cheryl [name]  MRN: [id number]  Date: 08/29/2022   Payor Source: Payor: Monia Pouch [employer]TER HE[employer] - [place] / Plan: AETNA BETTER HEALTH - [place] / Product Type: Medicaid MC /   CPT Code: 11914  Time: 2:30-3:30  Number in Group: 7  Peer Recovery Support Specialist present during group: no    Chief Concern: "Group Therapy for Substance Use Disorder"      Subjective:   Cheryl [name] presents today for group therapy through the Comprehensive Opioid Addiction Treatment (COAT) program.  She reports >90 clinic days. The purpose of this group was to introduce new group members, provide support to group members who are struggling and practice mindfulness. She specifically discussed attending meetings during the week and that she is liking her new job.     Objective:   Mood: within normal limits  Affect: congruent to mood   Appearance/Behavior: Alert, Good eye contact, Good participation, Receptive, Supportive of peers, Oriented , Dressed appropriately , and No acute distress   Participation:  active and attentive      Assessment:  Opioid Use Disorder, severe, on maintenance therapy   Other SUD Diagnoses: None  Procedure:   This is a Biweekly  process and psychoeducational group designed to be appropriate to individuals with opioid use disorder in recovery. The focus of this group to educate about the process of building a sound recovery program, to facilitate the understanding of the disease of addiction and how Medication Assisted Treatment (MAT)  works to aid in the development of recovery, and to support the practice of new recovery tools. Attendance and experience at peer recovery meetings was reviewed. Patients were encouraged to identify triggers for relapse, employ relapse prevention strategies and to develop a supportive social network.    Plan:  Maintain abstinence, follow recommendations of Treatment Team, Continue attending the COAT Clinic group and take medication as prescribed. Attend the encouraged peer recovery meetings.    Governor Specking, STUDENT SOCIAL WORKER  08/30/2022, 20:30

## 2022-09-11 ENCOUNTER — Encounter (INDEPENDENT_AMBULATORY_CARE_PROVIDER_SITE_OTHER): Payer: Self-pay

## 2022-09-11 NOTE — Telephone Encounter (Signed)
Person notified by MyChart only as vm was not set up to present to approved outside location by EOD on 09/13/22 for OBSERVED (unless absolutely not possible)  urine drug screen.  Pt to be sent out for ETGR, GABA , BUP, and FENT       Cheryl [name], CT  09/11/2022, 11:12

## 2022-09-12 ENCOUNTER — Telehealth: Payer: MEDICAID | Admitting: Counselor - Addiction

## 2022-09-12 ENCOUNTER — Telehealth: Payer: MEDICAID | Admitting: Psychiatry

## 2022-09-12 ENCOUNTER — Encounter (HOSPITAL_COMMUNITY): Payer: Self-pay

## 2022-09-12 ENCOUNTER — Other Ambulatory Visit: Payer: Self-pay

## 2022-09-12 ENCOUNTER — Other Ambulatory Visit: Payer: MEDICAID | Attending: Psychiatry

## 2022-09-12 DIAGNOSIS — F1190 Opioid use, unspecified, uncomplicated: Secondary | ICD-10-CM | POA: Insufficient documentation

## 2022-09-12 DIAGNOSIS — F1120 Opioid dependence, uncomplicated: Secondary | ICD-10-CM

## 2022-09-12 DIAGNOSIS — F17200 Nicotine dependence, unspecified, uncomplicated: Secondary | ICD-10-CM

## 2022-09-12 DIAGNOSIS — Z79899 Other long term (current) drug therapy: Secondary | ICD-10-CM

## 2022-09-12 DIAGNOSIS — F1121 Opioid dependence, in remission: Secondary | ICD-10-CM

## 2022-09-12 MED ORDER — BUPRENORPHINE 8 MG-NALOXONE 2 MG SUBLINGUAL FILM
1.0000 | ORAL_FILM | Freq: Two times a day (BID) | SUBLINGUAL | 0 refills | Status: DC
Start: 2022-09-12 — End: 2022-09-25

## 2022-09-12 NOTE — Progress Notes (Signed)
[facility]  [place] Controlled Substance Full Name Report Report Date 09/12/2022   From 06/12/2022 To 09/11/2022 Date of Birth 1986-02-16 Prescription Count 6   Last Name Weinmann First Name Damya Middle Name                                      Patients included in report that appear to match the search criteria.   Last Name First Name Middle Name Gender Address   Happy Maisy   F [address] , [place], [place], [address]            Prescriber Name Prescriber DEA & Zip Dispenser Name Dispenser DEA & Zip Rx Written Date Rx Dispense Date  & Date Sold    Rx Number Product Name MEDD Status Strength Qty Days # of Refill Sched Payment Type   Aleta Manternach [address], [place], [address]   [name] [id number]  [facility] [id number] [phone] 08/29/2022 08/29/2022 08/29/2022 [id number] Suboxone Film INACTIVE 8 mg/1; 2 mg/1 28 14  0/0 CIII Medicaid   [name] [id number] [address] [facility] [id number] [phone] 08/15/2022 08/15/2022 08/15/2022 [id number] Suboxone Film INACTIVE 8 mg/1; 2 mg/1 28 14  0/0 CIII Medicaid   [name] [id number] [address] [employer] [id number] [phone] 08/01/2022 08/01/2022 08/01/2022 [id number] Suboxone Film INACTIVE 8 mg/1; 2 mg/1 28 14  0/0 CIII Medicaid   [name] [id number] [address] [employer] [id number] [phone] 07/18/2022 07/18/2022 07/18/2022 [id number] Suboxone Film INACTIVE 8 mg/1; 2 mg/1 28 14  0/0 CIII Medicaid   [name] [id number]  [place]. [id number] [phone] 07/04/2022 07/04/2022 07/04/2022 [id number] Suboxone Film INACTIVE 8 mg/1; 2 mg/1 28 14  0/0 CIII Medicaid   [name], Fnp-Bc [id number] [phone] [place] [id number] [phone] 06/20/2022 06/20/2022 06/20/2022 [id number] Suboxone Film INACTIVE 8 mg/1; 2 mg/1 28 14  0/0 CIII Medicaid            [name], CASE MANAGER

## 2022-09-12 NOTE — Progress Notes (Addendum)
[facility]  Department of Behavioral Medicine Outpatient  Comprehensive Opioid Addiction Treatment (COAT) Program  Biweekly Group Progress Note    TELEMEDICINE DOCUMENTATION:    Patient Location:  Zoom Visit from home in [place], [place]  Patient/family aware of provider location:  Yes  Patient/family consent for telemedicine:  Yes  Examination observed and performed by: [name], DO    Video Visit, via Zoom      Patient Name:  Cheryl [name]  MRN: [id number]  Date: 09/12/2022   Payor Source: Payor: Monia Pouch BETTER HEALTH - [place] / Plan: Monia P[employer] BETTER HEALTH - [place] / Product Type: Medicaid MC /   Peer Recovery Support Specialist present during group: no    Chief Concern: "Follow up Med Check for Substance Use Disorder"    Current MOUD:  Buprenorphine Dose: 16 mg/daily       Subjective:    Patient reports 362 clinic days. "Had a good few weeks." Promoted at work. Grateful. Doing well in recovery. Did 3 meetings and watching online recovery talks. Would like to group.  Nicotine: NRT Patch unfilled as needs to call Quit Line.  Cravings: None  Withdrawal Symptoms: None  Side Effects: none    Motivation: Good      Objective:  Mood: within normal limits    Affect: stable     Speech: normal rate/ clear/ coherent    Thought process: Goal directed/Linear     Attention: maintained and interactive     Appearance: Alert, Receptive, Oriented , and No acute distress      Insight: Good      Urine drug screen:      07/10/2021     9:00 AM 08/31/2021     2:00 PM 10/05/2021     9:00 AM 11/27/2021    12:47 PM 12/28/2021     1:00 PM 02/15/2022     9:00 AM 05/09/2022    12:00 PM   Drug Screen Results   Amphetamine (AMP) Negative Negative Negative Negative Negative Negative Negative   Barbiturates (BAR) Negative Negative Negative Negative Negative Negative Negative   BUP - Cut off Levels 10 ng/ml Positive Positive Positive Positive Positive Positive Positive   Benzodiazepine (BZO) Negative Negative Negative Negative Negative Negative Negative   Cocaine  (COC) Negative Negative Negative Negative Negative Negative Negative   Methamphetamine (MET) Negative Negative Negative Negative Negative Negative Negative   Methadone (MTD) Negative Negative Negative Negative Negative Negative Negative   Oxycodone (OXY) Negative Negative Negative Negative Negative Negative Negative   Marijuana (THC) Negative Negative Negative Negative Negative Negative Negative   Ecstasy (MDMA) Negative Negative Negative Negative Negative Negative Negative   Phencyclidine (PCP) Negative Negative Negative Negative Negative Negative Negative   Tricyclic Antidepressant (TCA)   Negative Negative Negative Negative Negative   Temperature within range? yes yes yes yes yes yes yes   Observed no yes no no no yes no   Tester Lennox Pippins Jk [place] Ciara Ciara Ciara   Physician [address] [place] [employer]   Lot # [id number] [phone] [id number] [phone] [id number] [phone] [id number]   Expiration Date 06/20/2022 06/20/2022 04/27/2023 04/27/23 04/20/2023 04/20/2023 09/04/2023   Internal Control Valid yes yes yes yes yes yes yes   Initials CND CND Jk ak CND CND CND       Assessment:  Opioid Use Disoder, severe, in early remission, on maintenance therapy     Other SUD Diagnoses: Tobacco Use Disorder  Procedure: Patient was seen in the COAT Clinic in the Department  of Behavioral Medicine and Psychiatry in [place], [place]. This clinic visit included medication management, addiction education, and evaluation of progress in recovery in a group setting. Patient's progress, psychosocial functioning and treatment response was discussed during the treatment team meeting with the case manager and/or therapist to assist in medical decision-making.   Plan:  Return for next scheduled appointment in COAT Clinic  Continue recovery work, including peer recovery meetings.  Continue current MOUD 16 mg/daily .  Continue vaping cessation.    [name], DO

## 2022-09-12 NOTE — Addendum Note (Signed)
Addended by: [name] on: 09/12/2022 04:40 PM     Modules accepted: Level of Service

## 2022-09-14 LAB — CREATININE URINE, RANDOM: CREATININE RANDOM URINE: 60 mg/dL (ref 50–100)

## 2022-09-15 LAB — DRUG MONITORING, GABAPENTIN, QUANTITATIVE, URINE: GABAPENTIN: NEGATIVE ng/mL (ref <1000)

## 2022-09-15 LAB — NOTES AND COMMENTS

## 2022-09-17 LAB — ETHYL GLUCURONIDE AND ETHYL SULFATE, URINE
ETHYL GLUCURONIDE: NEGATIVE
ETHYL SULFATE: NEGATIVE

## 2022-09-17 LAB — ETHYL GLUCURONIDE & ETHYL SULFATE (ALCOHOL METABOLITES),URINE
CREATININE RANDOM URINE: 60 mg/dL (ref >=20)
ETHYL GLUCURONIDE: NEGATIVE
ETHYL SULFATE: NEGATIVE

## 2022-09-17 LAB — SUBOXONE CONFIRMATORY/DEFINITIVE, URINE, BY LC-MS/MS (PERFORMABLE)
BUPRENORPHINE: 127 ng/mL — ABNORMAL HIGH (ref <10)
NALOXONE: 782 ng/mL — ABNORMAL HIGH (ref <5)
NORBUPRENORPHINE: 469 ng/mL — ABNORMAL HIGH (ref <10)

## 2022-09-17 LAB — FENTANYL CONFIRMATORY/DEFINITIVE, URINE, BY LC-MS/MS (PERFORMABLE)
FENTANYL INTERPRETATION: NEGATIVE
FENTANYL: NOT DETECTED ng/mL (ref <0.5)
NORFENTANYL: NOT DETECTED ng/mL (ref <2)

## 2022-09-22 NOTE — Progress Notes (Signed)
TELEMEDICINE DOCUMENTATION:    Patient Location: [place]   Patient/family aware of provider location:  Yes  Patient/family consent for telemedicine:  Yes  Examination observed and performed by:  Lynnda Wiersma L. Wilfrid Lund, [facility]    I personally offered the service to the patient, and obtained verbal consent to provide this service.    Cheryl [name], [facility]    This service occurred via video.     COAT GROUP PROGRESS NOTE    GROUP THERAPY    NAME: Cheryl [name]  Date of Service: 09/12/2022  TIME: 2:30 pm to 3:30 pm  DURATION: 60 minutes  CPT CODE: [address]  CHIEF COMPLAINT: Group Therapy for Addiction  NUMBER IN GROUP: 6    Subjective: Patient seen for group therapy.  Patient has 362 days without the use of substances and has attended 3 required meetings.     Group focused on how a lot of the patients were discussing how they wanted to quit or were thinking about stop smoking.  We talked about how nicotine is a drug and when trying to quit smoking they should focus on developing and implementing positive coping skills to help them quit smoking as they are doing for their recovery.  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        Observation:  Mood: Pleasant   Affect: Congruent   Thought processes: Logical  Participation: Patient participated willingly in group.         Assessment/Diagnosis: Opioid Use Disorder, with dependence, on maintenance therapy (F11.20)    Procedure: Patient attended the Bi-Weekly COAT Group. The focus of this group to educate about the process of  building a sound recovery program, to facilitate the understanding of the disease of addiction and how Suboxone works to aid in the development of recovery, and to support the practice of new recovery tools. Encouragement of attendance at 12 step recovery meetings, identification of triggers for use/ relapse and the development of a supportive social network were also  covered.    Plan: Abstinence, take suboxone as prescribed, attend at least 4 NA/AA meetings a week (as required) and return to clinic as scheduled.      Cheryl [name],  Hospital  Clinical Therapist  [place] [place] Department of Behavioral Medicine and Psychiatry        I discussed this patient with the therapist and agree with the plan of care as outlined above.     Cheryl Hawks, DO 09/24/2022

## 2022-09-25 ENCOUNTER — Encounter (INDEPENDENT_AMBULATORY_CARE_PROVIDER_SITE_OTHER): Payer: Self-pay

## 2022-09-26 ENCOUNTER — Ambulatory Visit (HOSPITAL_COMMUNITY): Payer: MEDICAID | Admitting: Family

## 2022-09-26 ENCOUNTER — Ambulatory Visit (HOSPITAL_COMMUNITY): Payer: MEDICAID | Admitting: Clinical

## 2022-09-26 DIAGNOSIS — F1120 Opioid dependence, uncomplicated: Secondary | ICD-10-CM

## 2022-09-27 ENCOUNTER — Telehealth: Payer: MEDICAID | Admitting: Family

## 2022-09-27 ENCOUNTER — Other Ambulatory Visit: Payer: Self-pay

## 2022-09-27 DIAGNOSIS — F1121 Opioid dependence, in remission: Secondary | ICD-10-CM

## 2022-09-27 DIAGNOSIS — F1120 Opioid dependence, uncomplicated: Secondary | ICD-10-CM

## 2022-09-27 MED ORDER — BUPRENORPHINE 8 MG-NALOXONE 2 MG SUBLINGUAL FILM
1.0000 | ORAL_FILM | Freq: Two times a day (BID) | SUBLINGUAL | 1 refills | Status: DC
Start: 2022-09-27 — End: 2022-10-27

## 2022-09-27 NOTE — Progress Notes (Signed)
[facility]  Department of Behavioral Medicine Outpatient  Comprehensive Opioid Addiction Treatment (COAT) Program  Make-up Group Progress Note    TELEMEDICINE DOCUMENTATION:    Patient Location:  Zoom Visit from [address] [address]  Patient/family aware of provider location:  Yes  Patient/family consent for telemedicine:  Yes  Examination observed and performed by: Minette Headland Cheryl Kaser, FNP-BC    Video Visit, via Zoom      Patient Name:  Cheryl [name]  MRN: [id number]  Date: 09/27/2022   Payor Source: Payor: Monia Pouch BETTER HEALTH - [place] / Plan: AETNA BETTER HEALTH - [place] / Product Type: Medicaid [place] /   Peer Recovery Support Specialist present during group: no    Chief Concern: "Follow up Med Check for Substance Use Disorder"    Current MOUD:  Buprenorphine Dose: 16 mg/daily       Subjective:    Patient reports 377 clinic days. Changing to new monthly group from biweekly and unsure of date of visits. Case manager provided information. Cheryl [name] lives in [place]. Happy to be able to do screens at [facility] location now. Had lived in [facility] previously and appreciates being able to stay with the COAT program. Sobriety going well. No complaints/concerns.   Cravings: None  Withdrawal Symptoms: None  Side Effects: none    Motivation: Good      Objective:  Mood: within normal limits    Affect: stable     Speech: normal rate/ clear/ coherent    Thought process: Goal directed/Linear     Attention: maintained and interactive     Appearance: Alert, Receptive, Oriented , and No acute distress      Insight: Good      Urine drug screen:      07/10/2021     9:00 AM 08/31/2021     2:00 PM 10/05/2021     9:00 AM 11/27/2021    12:47 PM 12/28/2021     1:00 PM 02/15/2022     9:00 AM 05/09/2022    12:00 PM   Drug Screen Results   Amphetamine (AMP) Negative Negative Negative Negative Negative Negative Negative   Barbiturates (BAR) Negative Negative Negative Negative Negative Negative Negative   BUP - Cut off Levels 10  ng/ml Positive Positive Positive Positive Positive Positive Positive   Benzodiazepine (BZO) Negative Negative Negative Negative Negative Negative Negative   Cocaine (COC) Negative Negative Negative Negative Negative Negative Negative   Methamphetamine (MET) Negative Negative Negative Negative Negative Negative Negative   Methadone (MTD) Negative Negative Negative Negative Negative Negative Negative   Oxycodone (OXY) Negative Negative Negative Negative Negative Negative Negative   Marijuana (THC) Negative Negative Negative Negative Negative Negative Negative   Ecstasy (MDMA) Negative Negative Negative Negative Negative Negative Negative   Phencyclidine (PCP) Negative Negative Negative Negative Negative Negative Negative   Tricyclic Antidepressant (TCA)   Negative Negative Negative Negative Negative   Temperature within range? yes yes yes yes yes yes yes   Observed no yes no no no yes no   Tester Lennox Pippins Jk [place] Ciara Ciara Ciara   Physician [address] [employer]   [place] [id number] [phone] [id number] [phone] [id number]   Expiration Date 06/20/2022 06/20/2022 04/27/2023 04/27/23 04/20/2023 04/20/2023 09/04/2023   Internal Control Valid yes yes yes yes yes yes yes   Initials CND CND Jk ak CND CND CND       Assessment:  Opioid Use Disorder, in sustained remission, on maintenance  therapy     Other SUD Diagnoses: None  Procedure: Patient was seen in the COAT Clinic in the Department of Behavioral Medicine and Psychiatry in [place], [place]. This clinic visit included medication management, addiction education, and evaluation of progress in recovery in a group setting. Patient's progress, psychosocial functioning and treatment response was discussed during the treatment team meeting with the case manager and/or therapist to assist in medical decision-making.   Plan:  Return for next scheduled appointment in COAT Clinic  Continue recovery work, including peer recovery  meetings.  Continue Current MOUD: Continue previously prescribed  8/2 mg Buprenorphine-naloxone (Suboxone) Films , Buprenorphine Dose:16 mg/daily .    Minette Headland Rosselyn Martha, FNP-BC  09/27/2022, 17:13

## 2022-10-27 NOTE — Progress Notes (Unsigned)
[facility]  Department of Behavioral Medicine Outpatient  Comprehensive Opioid Addiction Treatment (COAT) Program  Monthly Group Progress Note    TELEMEDICINE DOCUMENTATION:    Patient Location:  Zoom Visit from [address] [address]  Patient/family aware of provider location:  Yes  Patient/family consent for telemedicine:  Yes  Examination observed and performed by: [name], MD    Video Visit, via Zoom      Patient Name:  Cheryl [name]  MRN: [id number]  Date: 10/28/2022   Payor Source: Payor: Monia Pouch BETTER HEALTH - [place] / Plan: Monia Pouch BETTER HEALTH - [place] / Product Type: Medicaid [place] /   Peer Recovery Support Specialist present during group: no    Chief Concern: "Follow up Med Check for Substance Use Disorder"    Current MOUD:  Buprenorphine Dose: 16 mg/daily       Subjective:    Patient reports 408 clinic days. Changing to new monthly group from biweekly and unsure of date of visits. Case manager provided information. Kaesha lives in [place]. Happy to be able to do screens at [facility] location now. Had lived in [facility] sober living previously and appreciates being able to stay with the COAT program. Sobriety going well. No complaints/concerns.   Cravings: None  Withdrawal Symptoms: None  Side Effects: none    Motivation: Good      Objective:  Mood: within normal limits    Affect: stable     Speech: normal rate/ clear/ coherent    Thought process: Goal directed/Linear     Attention: maintained and interactive     Appearance: Alert, Receptive, Oriented , and No acute distress      Insight: Good      Urine drug screen:      07/10/2021     9:00 AM 08/31/2021     2:00 PM 10/05/2021     9:00 AM 11/27/2021    12:47 PM 12/28/2021     1:00 PM 02/15/2022     9:00 AM 05/09/2022    12:00 PM   Drug Screen Results   Amphetamine (AMP) Negative Negative Negative Negative Negative Negative Negative   Barbiturates (BAR) Negative Negative Negative Negative Negative Negative Negative   BUP - Cut off Levels 10 ng/ml  Positive Positive Positive Positive Positive Positive Positive   Benzodiazepine (BZO) Negative Negative Negative Negative Negative Negative Negative   Cocaine (COC) Negative Negative Negative Negative Negative Negative Negative   Methamphetamine (MET) Negative Negative Negative Negative Negative Negative Negative   Methadone (MTD) Negative Negative Negative Negative Negative Negative Negative   Oxycodone (OXY) Negative Negative Negative Negative Negative Negative Negative   Marijuana (THC) Negative Negative Negative Negative Negative Negative Negative   Ecstasy (MDMA) Negative Negative Negative Negative Negative Negative Negative   Phencyclidine (PCP) Negative Negative Negative Negative Negative Negative Negative   Tricyclic Antidepressant (TCA)   Negative Negative Negative Negative Negative   Temperature within range? yes yes yes yes yes yes yes   Observed no yes no no no yes no   Tester Lennox Pippins Jk [place] Ciara Ciara Ciara   Physician [address] [employer]   [place] [id number] [phone] [id number] [phone] [id number]   Expiration Date 06/20/2022 06/20/2022 04/27/2023 04/27/23 04/20/2023 04/20/2023 09/04/2023   Internal Control Valid yes yes yes yes yes yes yes   Initials CND CND Jk ak CND CND CND       Assessment:  Opioid Use Disorder, in sustained remission, on maintenance therapy  Other SUD Diagnoses: None  Procedure: Patient was seen in the COAT Clinic in the Department of Behavioral Medicine and Psychiatry in [place], [place]. This clinic visit included medication management, addiction education, and evaluation of progress in recovery in a group setting. Patient's progress, psychosocial functioning and treatment response was discussed during the treatment team meeting with the case manager and/or therapist to assist in medical decision-making.   Plan:  Return for next scheduled appointment in COAT Clinic  Continue recovery work, including peer recovery  meetings.  Continue Current MOUD: Continue previously prescribed  8/2 mg Buprenorphine-naloxone (Suboxone) Films , Buprenorphine Dose:16 mg/daily .    [name], MD

## 2022-10-28 ENCOUNTER — Other Ambulatory Visit: Payer: Self-pay

## 2022-10-28 ENCOUNTER — Telehealth: Payer: MEDICAID | Admitting: Clinical

## 2022-10-28 ENCOUNTER — Telehealth: Payer: MEDICAID | Admitting: Emergency Medicine

## 2022-10-28 ENCOUNTER — Encounter (INDEPENDENT_AMBULATORY_CARE_PROVIDER_SITE_OTHER): Payer: Self-pay

## 2022-10-28 DIAGNOSIS — F1121 Opioid dependence, in remission: Secondary | ICD-10-CM

## 2022-10-28 DIAGNOSIS — F1120 Opioid dependence, uncomplicated: Secondary | ICD-10-CM

## 2022-10-28 DIAGNOSIS — Z79899 Other long term (current) drug therapy: Secondary | ICD-10-CM

## 2022-10-28 MED ORDER — BUPRENORPHINE 8 MG-NALOXONE 2 MG SUBLINGUAL FILM
1.0000 | ORAL_FILM | Freq: Two times a day (BID) | SUBLINGUAL | 0 refills | Status: DC
Start: 2022-10-28 — End: 2022-11-26

## 2022-11-01 NOTE — Psychotherapy Note (Signed)
[facility]  Department of Behavioral Medicine Outpatient  Comprehensive Opioid Addiction Treatment (COAT) Program  Monthly Group Therapy Note    TELEMEDICINE DOCUMENTATION:    Patient Location: Zoom Visit from [address] [address]   Patient/family aware of provider location:  Yes  Patient/family consent for telemedicine:  Yes  Examination observed and performed by: Baron Sane, LCSW      Video Visit, via Zoom     Patient Name:  Cheryl [name]  MRN: [id number]  Date: 10/28/2022   Payor Source: Payor: Monia Pouch BETTER HEALTH - [place] / Plan: AETNA BETTER HEALTH - [place] / Product Type: Medicaid [place] /   CPT Code: 11914  Time: 9:45-10:31  Number in Group: 11  Peer Recovery Support Specialist present during group: no    Chief Concern: "Group Therapy for Substance Use Disorder"      Subjective:   Mary-Anne presents today for group therapy through the Comprehensive Opioid Addiction Treatment (COAT) program.  She reports 407 clinic days. The purpose of this group was for group members to share current stressors and successes in their lives, and offer support to the other group members. Therapist used Motivatoinal Interviewing to conduct group. . She specifically discussed her sobriety journey up until this point. .    Objective:   Mood: within normal limits  Affect: congruent to mood   Appearance/Behavior: Alert, Good eye contact, Good participation, Receptive, Supportive of peers, Oriented , Dressed appropriately , and No acute distress   Participation:  active and attentive      Assessment:  Opioid Use Disorder, severe, on maintenance therapy   Other SUD Diagnoses: None  Procedure:   This is a Monthly  process and psychoeducational group designed to be appropriate to individuals with opioid use disorder in recovery. The focus of this group to educate about the process of building a sound recovery program, to facilitate the understanding of the disease of addiction and how Medication Assisted Treatment (MAT) works to aid in the  development of recovery, and to support the practice of new recovery tools. Attendance and experience at peer recovery meetings was reviewed. Patients were encouraged to identify triggers for relapse, employ relapse prevention strategies and to develop a supportive social network.    Plan:  Maintain abstinence, follow recommendations of Treatment Team, Continue attending the COAT Clinic group and take medication as prescribed. Attend the encouraged peer recovery meetings.    Baron Sane, LCSW  11/01/2022, 10:47      I have read and reviewed this document. Belinda Fisher, LICSW

## 2022-11-04 ENCOUNTER — Encounter (HOSPITAL_COMMUNITY): Payer: Self-pay

## 2022-11-04 NOTE — Progress Notes (Signed)
[facility]  [place] Controlled Substance Full Name Report Report Date 11/04/2022   From 08/05/2022 To 11/03/2022 Date of Birth 1986-01-11 Prescription Count 6   Last Name Kazmi First Name Murrell Middle Name                                      Patients included in report that appear to match the search criteria.   Last Name First Name Middle Name Gender Address   [name]   F [address] , [place], [place], [address]            Prescriber Name Prescriber DEA & Zip Dispenser Name Dispenser DEA & Zip Rx Written Date Rx Dispense Date  & Date Sold    Rx Number Product Name MEDD Status Strength Qty Days # of Refill Sched Payment Type   [name] [address], [place], [address]   [name] [id number]  [facility] [id number] [phone] 10/28/2022 10/28/2022 10/28/2022 95188 Suboxone Film ACTIVE 8 mg/1; 2 mg/1 56 28 0/0 CIII Medicaid   [name] [id number] [phone] [facility] [id number] [phone] 09/27/2022 10/11/2022 10/11/2022 74179 Suboxone Film INACTIVE 8 mg/1; 2 mg/1 32 16 1/1 CIII Medicaid   [name] [id number] [phone] [facility] [id number] [phone] 09/27/2022 09/27/2022 09/27/2022 74179 Suboxone Film INACTIVE 8 mg/1; 2 mg/1 32 16 0/1 CIII Medicaid   [name] [id number] [facility] [id number] [phone] 09/12/2022 09/12/2022 09/13/2022 72473 Suboxone Film INACTIVE 8 mg/1; 2 mg/1 28 14  0/0 CIII Medicaid   [name] [id number]  [facility] [id number] [phone] 08/29/2022 08/29/2022 08/29/2022 71120 Suboxone Film INACTIVE 8 mg/1; 2 mg/1 28 14  0/0 CIII Medicaid   [name] [id number] [facility] [id number] [phone] 08/15/2022 08/15/2022 08/15/2022 93267 Suboxone Film INACTIVE 8 mg/1; 2 mg/1 28 14  0/0 CIII Medicaid            [name], CASE MANAGER

## 2022-11-22 ENCOUNTER — Encounter (HOSPITAL_COMMUNITY): Payer: Self-pay

## 2022-11-22 NOTE — Progress Notes (Signed)
[facility]  [place] Full Name Report Report Date 11/22/2022   From 08/22/2022 To 11/21/2022 Date of Birth 10/11/1985 Prescription Count 5   Last Name Bottoms First Name Honore Middle Name                                      Patients included in report that appear to match the search criteria.   Last Name First Name Middle Name Gender Address   Sudan Shian   F [address] , [place], [place], [address]            Prescriber Name Prescriber DEA & Zip Dispenser Name Dispenser DEA & Zip Rx Written Date Rx Dispense Date  & Date Sold    Rx Number Product Name MEDD Status Strength Qty Days # of Refill Sched Payment Type   Brieann Perina [address], [place], [address]   [name] [id number]  [facility] [id number] [phone] 10/28/2022 10/28/2022 10/28/2022 40347 Suboxone Film ACTIVE 8 mg/1; 2 mg/1 56 28 0/0 CIII Medicaid   [name] [id number] [phone] [facility] [id number] [phone] 09/27/2022 10/11/2022 10/11/2022 16010 Suboxone Film INACTIVE 8 mg/1; 2 mg/1 32 16 1/1 CIII Medicaid   [name] [id number] [phone] [facility] [id number] [phone] 09/27/2022 09/27/2022 09/27/2022 74179 Suboxone Film INACTIVE 8 mg/1; 2 mg/1 32 16 0/1 CIII Medicaid   [name] [id number] [facility] [id number] [phone] 09/12/2022 09/12/2022 09/13/2022 72473 Suboxone Film INACTIVE 8 mg/1; 2 mg/1 28 14  0/0 CIII Medicaid   [name] [id number]  [facility] [id number] [phone] 08/29/2022 08/29/2022 08/29/2022 71120 Suboxone Film INACTIVE 8 mg/1; 2 mg/1 28 14  0/0 CIII Medicaid            [name], CASE MANAGER

## 2022-11-24 NOTE — Progress Notes (Signed)
The patient did not appear for their appointment/or scheduled appointment was cancelled.  This office visit opened in error.

## 2022-11-25 ENCOUNTER — Telehealth: Payer: MEDICAID | Admitting: Emergency Medicine

## 2022-11-25 ENCOUNTER — Encounter (INDEPENDENT_AMBULATORY_CARE_PROVIDER_SITE_OTHER): Payer: Self-pay

## 2022-11-25 ENCOUNTER — Other Ambulatory Visit: Payer: Self-pay

## 2022-11-25 ENCOUNTER — Ambulatory Visit (INDEPENDENT_AMBULATORY_CARE_PROVIDER_SITE_OTHER): Payer: MEDICAID | Admitting: Clinical

## 2022-11-25 DIAGNOSIS — F1120 Opioid dependence, uncomplicated: Secondary | ICD-10-CM

## 2022-11-25 DIAGNOSIS — Z029 Encounter for administrative examinations, unspecified: Secondary | ICD-10-CM

## 2022-11-26 ENCOUNTER — Telehealth: Payer: MEDICAID | Admitting: Psychiatry

## 2022-11-26 DIAGNOSIS — Z79899 Other long term (current) drug therapy: Secondary | ICD-10-CM

## 2022-11-26 DIAGNOSIS — F1120 Opioid dependence, uncomplicated: Secondary | ICD-10-CM

## 2022-11-26 MED ORDER — BUPRENORPHINE 8 MG-NALOXONE 2 MG SUBLINGUAL FILM
1.0000 | ORAL_FILM | Freq: Two times a day (BID) | SUBLINGUAL | 0 refills | Status: DC
Start: 2022-11-26 — End: 2022-12-22

## 2022-11-26 NOTE — Progress Notes (Signed)
COAT TELE-Clinic Progress Note  TELEMEDICINE DOCUMENTATION:    Patient Location:  [address] [address]   Patient/family aware of provider location:  YES  Patient/family consent for telemedicine:  YES    The group modality was facilitated via Zoom.     The patient was seen as part of a collaborative telemedicine service with their COAT treatment team who participated in the encounter by active presence via approved video/audio means for portions of the encounter.  This clinic visit included medication management, addiction education, and evaluation of progress in recovery.     Examination observed and performed by:  Allayne Gitelman. Allyson Sabal, DO COAT Clinic Tele-Video-Audio Progress Note  Patient Name:  Cheryl [name]  MRN: [id number]  Date: 11/26/2022   Chief Complaint: "Addiction"  Current MAT: Buprenorphine   16 mg  Subjective:    Patient has been sober for 436 days.    Patient presents for a Make-Up COAT Clinic encounter. "Life is going pretty good!" Work and family has been stable. Will have bridges put in her mouth next week at her dentist. Discussed pain control. Will call us for extra suboxone on Monday if needed for pain control. Goes to 2 meetings on Mondays. On Step 10 with her sponsor. Son starts school soon.  Cravings-None   Withdrawal Symptoms- None  Side Effects-None    Motivation is good    General Exam:  Patient is in no acute distress  alert and oriented      Mental Status:  Speech is clear and coherent.  Thought process is linear.  No indication of responding to internal stimuli.  Affect is stable  Attention is sustained  Concentration is sustained  Insight is good    Assessment:  Opioid Dependence 304.9 - Stable        Plan:  Continue COAT Clinic and medication.    Coordination of Care:  Coordination of Care:  Patient was seen in the COAT Clinic in the Department of Behavioral Medicine and Psychiatry at [facility] in [place], [place]. This clinic visit included medication management, addiction  education, and evaluation of progress in recovery in a group setting. Patient's progress, psychosocial functioning and treatment response was discussed during the treatment team meeting with the case manager and/or therapist to assist in medical decision-making.    [name], DO 11/26/2022

## 2022-11-26 NOTE — Addendum Note (Signed)
Addended by: [name] on: 11/26/2022 04:00 PM     Modules accepted: Level of Service

## 2022-12-22 NOTE — Progress Notes (Unsigned)
[facility]  Department of Behavioral Medicine Outpatient  Comprehensive Opioid Addiction Treatment (COAT) Program  Monthly Group Progress Note    TELEMEDICINE DOCUMENTATION:    Patient Location:  Zoom Visit from [address] [address]  Patient/family aware of provider location:  Yes  Patient/family consent for telemedicine:  Yes  Examination observed and performed by: [name], MD    Video Visit, via Zoom      Patient Name:  Cheryl [name]  MRN: [id number]  Date: 12/23/2022   Payor Source: Payor: Monia Pouch [employer] - [place] / Plan: [employer] - [place] / Product Type: Medicaid MC /   Peer Recovery Support Specialist present during group: no    Chief Concern: "Follow up Med Check for Substance Use Disorder"    Current MOUD:  Buprenorphine Dose: 16 mg/daily       Subjective:    Patient reports 464 clinic days.  Doing well, work is good and she was able to buy her 1 yo son a car for when he starts driving soon.  Recovery stable.  No other concerns.    Cravings: None  Withdrawal Symptoms: None  Side Effects: none    Motivation: Good      Objective:  Mood: within normal limits    Affect: stable     Speech: normal rate/ clear/ coherent    Thought process: Goal directed/Linear     Attention: maintained and interactive     Appearance: Alert, Receptive, Oriented , and No acute distress      Insight: Good      Urine drug screen:      07/10/2021     9:00 AM 08/31/2021     2:00 PM 10/05/2021     9:00 AM 11/27/2021    12:47 PM 12/28/2021     1:00 PM 02/15/2022     9:00 AM 05/09/2022    12:00 PM   Drug Screen Results   Amphetamine (AMP) Negative Negative Negative Negative Negative Negative Negative   Barbiturates (BAR) Negative Negative Negative Negative Negative Negative Negative   BUP - Cut off Levels 10 ng/ml Positive Positive Positive Positive Positive Positive Positive   Benzodiazepine (BZO) Negative Negative Negative Negative Negative Negative Negative   Cocaine (COC) Negative Negative Negative Negative Negative  Negative Negative   Methamphetamine (MET) Negative Negative Negative Negative Negative Negative Negative   Methadone (MTD) Negative Negative Negative Negative Negative Negative Negative   Oxycodone (OXY) Negative Negative Negative Negative Negative Negative Negative   Marijuana (THC) Negative Negative Negative Negative Negative Negative Negative   Ecstasy (MDMA) Negative Negative Negative Negative Negative Negative Negative   Phencyclidine (PCP) Negative Negative Negative Negative Negative Negative Negative   Tricyclic Antidepressant (TCA)   Negative Negative Negative Negative Negative   Temperature within range? yes yes yes yes yes yes yes   Observed no yes no no no yes no   Tester Lennox Pippins Jk AK Ciara Ciara Ciara   Physician [address] [place]   Lot # [id number] [phone] [id number] [phone] [id number]   Expiration Date 06/20/2022 06/20/2022 04/27/2023 04/27/23 04/20/2023 04/20/2023 09/04/2023   Internal Control Valid yes yes yes yes yes yes yes   Initials CND CND Jk ak CND CND CND       Assessment:  Opioid Use Disorder, in sustained remission, on maintenance therapy     Other SUD Diagnoses: None  Procedure: Patient was seen in the COAT Clinic in the Department of Behavioral Medicine and  Psychiatry in [place], [place]. This clinic visit included medication management, addiction education, and evaluation of progress in recovery in a group setting. Patient's progress, psychosocial functioning and treatment response was discussed during the treatment team meeting with the case manager and/or therapist to assist in medical decision-making.   Plan:  Return for next scheduled appointment in COAT Clinic  Continue recovery work, including peer recovery meetings.  Continue Current MOUD: Continue previously prescribed  8/2 mg Buprenorphine-naloxone (Suboxone) Films , Buprenorphine Dose:16 mg/daily .    [name], MD

## 2022-12-23 ENCOUNTER — Other Ambulatory Visit: Payer: Self-pay

## 2022-12-23 ENCOUNTER — Telehealth: Payer: MEDICAID | Admitting: Emergency Medicine

## 2022-12-23 ENCOUNTER — Telehealth (INDEPENDENT_AMBULATORY_CARE_PROVIDER_SITE_OTHER): Payer: MEDICAID | Admitting: Clinical

## 2022-12-23 ENCOUNTER — Encounter (INDEPENDENT_AMBULATORY_CARE_PROVIDER_SITE_OTHER): Payer: Self-pay

## 2022-12-23 DIAGNOSIS — F1120 Opioid dependence, uncomplicated: Secondary | ICD-10-CM

## 2022-12-23 DIAGNOSIS — F1121 Opioid dependence, in remission: Secondary | ICD-10-CM

## 2022-12-23 DIAGNOSIS — Z79899 Other long term (current) drug therapy: Secondary | ICD-10-CM

## 2022-12-23 MED ORDER — BUPRENORPHINE 8 MG-NALOXONE 2 MG SUBLINGUAL FILM
1.0000 | ORAL_FILM | Freq: Two times a day (BID) | SUBLINGUAL | 0 refills | Status: DC
Start: 2022-12-23 — End: 2023-01-20

## 2022-12-23 NOTE — Nursing Note (Signed)
Person notified to present to [facility] by 6:30pm or to [facility] by 3:30 pm on 12/25/22 for OBSERVED (unless absolutely not possible)  urine drug screen.  Pt to be sent out for ETGR, GABA , BUP, and FENT     Pt reported no relapses.     Gita Kudo, MA

## 2022-12-24 ENCOUNTER — Other Ambulatory Visit (HOSPITAL_COMMUNITY): Payer: MEDICAID

## 2022-12-26 NOTE — Psychotherapy Note (Signed)
[facility]  Department of Behavioral Medicine Outpatient  Comprehensive Opioid Addiction Treatment (COAT) Program  Monthly Group Therapy Note    TELEMEDICINE DOCUMENTATION:    Patient Location: Zoom Visit from [address] [address]   Patient/family aware of provider location:  Yes  Patient/family consent for telemedicine:  Yes  Examination observed and performed by: Baron Sane, LCSW      Video Visit, via Zoom     Patient Name:  Cheryl [name]  MRN: [id number]  Date: 12/23/2022   Payor Source: Payor: Monia Pouch [employer]TER [employer] - [place] / Plan: [employer] BETTER HEALTH - [place] / Product Type: Medicaid MC /   CPT Code: 62952  Time: 9:30-10:29  Number in Group: 11  Peer Recovery Support Specialist present during group: no    Chief Concern: "Group Therapy for Substance Use Disorder"      Subjective:   Cheryl [name] presents today for group therapy through the Comprehensive Opioid Addiction Treatment (COAT) program.  She reports ? clinic days. The purpose of this group was for patients to reflect on their recent stressors, struggles, and successes. Patients explored their current situations, and offered each other support.. She specifically discussed how she accidentally missed last month.    Objective:   Mood: within normal limits  Affect: congruent to mood   Appearance/Behavior: Alert, Good eye contact, Good participation, Receptive, Supportive of peers, Oriented , Dressed appropriately , and No acute distress   Participation:  active and attentive      Assessment:  Opioid Use Disorder, severe, on maintenance therapy   Other SUD Diagnoses: None  Procedure:   This is a Monthly  process and psychoeducational group designed to be appropriate to individuals with opioid use disorder in recovery. The focus of this group to educate about the process of building a sound recovery program, to facilitate the understanding of the disease of addiction and how Medication Assisted Treatment (MAT) works to aid in the development of recovery, and to  support the practice of new recovery tools. Attendance and experience at peer recovery meetings was reviewed. Patients were encouraged to identify triggers for relapse, employ relapse prevention strategies and to develop a supportive social network.    Plan:  Maintain abstinence, follow recommendations of Treatment Team, Continue attending the COAT Clinic group and take medication as prescribed. Attend the encouraged peer recovery meetings.    Baron Sane, LCSW  12/26/2022, 16:14      I have read and reviewed this document. Belinda Fisher, LICSW

## 2022-12-28 ENCOUNTER — Encounter (HOSPITAL_COMMUNITY): Payer: Self-pay

## 2022-12-28 NOTE — Progress Notes (Signed)
[facility]  [place] Full Name Report Report Date 12/28/2022   From 09/27/2022 To 12/27/2022 Date of Birth 1985-08-10 Prescription Count 5   Last Name Gautier First Name Callahan Middle Name                                      Patients included in report that appear to match the search criteria.   Last Name First Name Middle Name Gender Address   [place] Ester   F [address] , [place], [place], [address]            Prescriber Name Prescriber DEA & Zip Dispenser Name Dispenser DEA & Zip Rx Written Date Rx Dispense Date  & Date Sold    Rx Number Product Name MEDD Status Strength Qty Days # of Refill Sched Payment Type   Arij Lydy [address], [place], [address]   [name] [id number]  [facility] [id number] [phone] 12/23/2022 12/23/2022 12/24/2022 83234 Suboxone Film ACTIVE 8 mg/1; 2 mg/1 56 28 0/0 CIII Medicaid   [name] [id number] [facility] [id number] [phone] 11/26/2022 11/26/2022 11/26/2022 48546 Suboxone Film INACTIVE 8 mg/1; 2 mg/1 54 27 0/0 CIII Medicaid   [name] [id number]  [facility] [id number] [phone] 10/28/2022 10/28/2022 10/28/2022 93810 Suboxone Film INACTIVE 8 mg/1; 2 mg/1 56 28 0/0 CIII Medicaid   [name] [id number] [phone] [facility] [id number] [phone] 09/27/2022 10/11/2022 10/11/2022 08676 Suboxone Film INACTIVE 8 mg/1; 2 mg/1 32 16 1/1 CIII Medicaid   [name] [id number] [phone] [facility] [id number] [phone] 09/27/2022 09/27/2022 09/27/2022 74179 Suboxone Film INACTIVE 8 mg/1; 2 mg/1 32 16 0/1 CIII Medicaid            [name], CASE MANAGER

## 2023-01-20 ENCOUNTER — Encounter (INDEPENDENT_AMBULATORY_CARE_PROVIDER_SITE_OTHER): Payer: Self-pay

## 2023-01-20 ENCOUNTER — Telehealth (INDEPENDENT_AMBULATORY_CARE_PROVIDER_SITE_OTHER): Payer: MEDICAID | Admitting: Clinical

## 2023-01-20 ENCOUNTER — Encounter (INDEPENDENT_AMBULATORY_CARE_PROVIDER_SITE_OTHER): Payer: Self-pay | Admitting: Clinical

## 2023-01-20 ENCOUNTER — Other Ambulatory Visit: Payer: Self-pay

## 2023-01-20 ENCOUNTER — Telehealth (INDEPENDENT_AMBULATORY_CARE_PROVIDER_SITE_OTHER): Payer: MEDICAID | Admitting: Emergency Medicine

## 2023-01-20 DIAGNOSIS — F1120 Opioid dependence, uncomplicated: Secondary | ICD-10-CM

## 2023-01-20 DIAGNOSIS — F1121 Opioid dependence, in remission: Secondary | ICD-10-CM

## 2023-01-20 MED ORDER — BUPRENORPHINE 8 MG-NALOXONE 2 MG SUBLINGUAL FILM
1.0000 | ORAL_FILM | Freq: Two times a day (BID) | SUBLINGUAL | 0 refills | Status: DC
Start: 2023-01-20 — End: 2023-02-18

## 2023-01-20 NOTE — Progress Notes (Signed)
[facility]  Department of Behavioral Medicine Outpatient  Comprehensive Opioid Addiction Treatment (COAT) Program  Monthly Group Progress Note    TELEMEDICINE DOCUMENTATION:    Patient Location:  Zoom Visit from [address] [address]  Patient/family aware of provider location:  Yes  Patient/family consent for telemedicine:  Yes  Examination observed and performed by: [name], MD    Video Visit, via Zoom      Patient Name:  Cheryl [name]  MRN: [id number]  Date: 01/20/2023   Payor Source: Payor: [employer]ia [employer]ouch BETTER [employer] - [place] / Plan: [employer] BETTER HE[employer] - [place] / Product Type: Medicaid [place] /   Peer Recovery Support Specialist present during group: no    Chief Concern: "Follow up Med Check for Substance Use Disorder"    Current MOUD:  Buprenorphine Dose: 16 mg/daily       Subjective:    Patient reports 492 clinic days.  Good month, will be changing insurance soon and they do not cover Suboxone, may be able to find a work around.  Will let us know if there are any issues getting the medication.  Recovery stable.  No other concerns.    Cravings: None  Withdrawal Symptoms: None  Side Effects: none    Motivation: Good      Objective:  Mood: within normal limits    Affect: stable     Speech: normal rate/ clear/ coherent    Thought process: Goal directed/Linear     Attention: maintained and interactive     Appearance: Alert, Receptive, Oriented , and No acute distress      Insight: Good      Urine drug screen:      07/10/2021     9:00 AM 08/31/2021     2:00 PM 10/05/2021     9:00 AM 11/27/2021    12:47 PM 12/28/2021     1:00 PM 02/15/2022     9:00 AM 05/09/2022    12:00 PM   Drug Screen Results   Amphetamine (AMP) Negative Negative Negative Negative Negative Negative Negative   Barbiturates (BAR) Negative Negative Negative Negative Negative Negative Negative   BUP - Cut off Levels 10 ng/ml Positive Positive Positive Positive Positive Positive Positive   Benzodiazepine (BZO) Negative Negative Negative Negative Negative  Negative Negative   Cocaine (COC) Negative Negative Negative Negative Negative Negative Negative   Methamphetamine (MET) Negative Negative Negative Negative Negative Negative Negative   Methadone (MTD) Negative Negative Negative Negative Negative Negative Negative   Oxycodone (OXY) Negative Negative Negative Negative Negative Negative Negative   Marijuana (THC) Negative Negative Negative Negative Negative Negative Negative   Ecstasy (MDMA) Negative Negative Negative Negative Negative Negative Negative   Phencyclidine (PCP) Negative Negative Negative Negative Negative Negative Negative   Tricyclic Antidepressant (TCA)   Negative Negative Negative Negative Negative   Temperature within range? yes yes yes yes yes yes yes   Observed no yes no no no yes no   Tester Lennox Pippins Jk AK Ciara Ciara Ciara   Physician [address] [place]   Lot # [id number] [phone] [id number] [phone] [id number]   Expiration Date 06/20/2022 06/20/2022 04/27/2023 04/27/23 04/20/2023 04/20/2023 09/04/2023   Internal Control Valid yes yes yes yes yes yes yes   Initials CND CND Jk ak CND CND CND       Assessment:  Opioid Use Disorder, in sustained remission, on maintenance therapy     Other SUD Diagnoses: None  Procedure: Patient was seen  in the COAT Clinic in the Department of Behavioral Medicine and Psychiatry in [place], [place]. This clinic visit included medication management, addiction education, and evaluation of progress in recovery in a group setting. Patient's progress, psychosocial functioning and treatment response was discussed during the treatment team meeting with the case manager and/or therapist to assist in medical decision-making.   Plan:  Return for next scheduled appointment in COAT Clinic  Continue recovery work, including peer recovery meetings.  Continue Current MOUD: Continue previously prescribed  8/2 mg Buprenorphine-naloxone (Suboxone) Films , Buprenorphine Dose:16 mg/daily  .    [name], MD

## 2023-01-20 NOTE — Nursing Note (Signed)
Person notified to present to [facility] by 6:30pm or to [facility] by 3:30 pm on 01/22/23 for OBSERVED (unless absolutely not possible)  urine drug screen.  Pt to be sent out for ETGR, GABA , BUP, and FENT     Pt reported 492 with No Relapse      Gita Kudo, MA

## 2023-01-21 NOTE — Psychotherapy Note (Signed)
[facility]  Department of Behavioral Medicine Outpatient  Comprehensive Opioid Addiction Treatment (COAT) Program  Monthly Group Therapy Note    TELEMEDICINE DOCUMENTATION:    Patient Location: Zoom Visit from [address] [address]   Patient/family aware of provider location:  Yes  Patient/family consent for telemedicine:  Yes  Examination observed and performed by: Baron Sane, LCSW      Video Visit, via Zoom     Patient Name:  Cheryl [name]  MRN: [id number]  Date: 01/20/2023   Payor Source: Payor: Monia Pouch BETTER [employer] - [place] / Plan: [employer] BETTER HEALTH - [place] / Product Type: Medicaid [place] /   CPT Code: 42595  Time: 9:25-10:16  Number in Group: 6  Peer Recovery Support Specialist present during group: no    Chief Concern: "Group Therapy for Substance Use Disorder"      Subjective:   Cheryl [name] presents today for group therapy through the Comprehensive Opioid Addiction Treatment (COAT) program.  She reports 492 clinic days. The purpose of this group was is to explore current stressors and offer support to other group members. Therapist also discussed monitoring mental health as colder and shorter days come. . She specifically discussed concerns regarding insurance, and caring for her mother with Dementia.    Objective:   Mood: within normal limits  Affect: congruent to mood   Appearance/Behavior: Alert, Good eye contact, Good participation, Receptive, Supportive of peers, Oriented , Dressed appropriately , and No acute distress   Participation:  active and attentive      Assessment:  Opioid Use Disorder, severe, on maintenance therapy   Other SUD Diagnoses: None  Procedure:   This is a Monthly  process and psychoeducational group designed to be appropriate to individuals with opioid use disorder in recovery. The focus of this group to educate about the process of building a sound recovery program, to facilitate the understanding of the disease of addiction and how Medication Assisted Treatment (MAT) works to aid in  the development of recovery, and to support the practice of new recovery tools. Attendance and experience at peer recovery meetings was reviewed. Patients were encouraged to identify triggers for relapse, employ relapse prevention strategies and to develop a supportive social network.    Plan:  Maintain abstinence, follow recommendations of Treatment Team, Continue attending the COAT Clinic group and take medication as prescribed. Attend the encouraged peer recovery meetings.    Baron Sane, LCSW  01/21/2023, 14:37    I have read and reviewed this document. Belinda Fisher, LICSW

## 2023-01-22 ENCOUNTER — Other Ambulatory Visit: Payer: Self-pay

## 2023-01-22 ENCOUNTER — Other Ambulatory Visit: Payer: MEDICAID | Attending: Emergency Medicine

## 2023-01-22 DIAGNOSIS — F1120 Opioid dependence, uncomplicated: Secondary | ICD-10-CM | POA: Insufficient documentation

## 2023-01-22 LAB — DRUG SCREEN, WITH CONFIRMATION, URINE
AMPHETAMINES URINE: NEGATIVE
BARBITURATES URINE: NEGATIVE
BENZODIAZEPINES URINE: NEGATIVE
BUPRENORPHINE URINE: POSITIVE — AB
CANNABINOIDS URINE: NEGATIVE
COCAINE METABOLITES URINE: NEGATIVE
FENTANYL, URINE: NEGATIVE
METHADONE URINE: NEGATIVE
OPIATES URINE: NEGATIVE
OXYCODONE URINE: NEGATIVE
PCP URINE: NEGATIVE

## 2023-01-27 LAB — SUBOXONE CONFIRMATORY/DEFINITIVE, URINE, BY LC-MS/MS (PERFORMABLE)
BUPRENORPHINE: 629 ng/mL — ABNORMAL HIGH (ref <10)
NALOXONE: >1000 ng/mL — ABNORMAL HIGH (ref <5)
NORBUPRENORPHINE: 425 ng/mL — ABNORMAL HIGH (ref <10)

## 2023-01-31 ENCOUNTER — Encounter (HOSPITAL_COMMUNITY): Payer: Self-pay

## 2023-01-31 NOTE — Progress Notes (Signed)
[facility]  [place] Full Name Report Report Date 01/31/2023   From 10/31/2022 To 01/30/2023 Date of Birth 02-18-1986 Prescription Count 3   Last Name Dollar First Name Tully Middle Name                                      Patients included in report that appear to match the search criteria.   Last Name First Name Middle Name Gender Address   [place]   F [address] , [place], [place], [address]            Prescriber Name Prescriber DEA & Zip Dispenser Name Dispenser DEA & Zip Rx Written Date Rx Dispense Date  & Date Sold    Rx Number Product Name MEDD Status Strength Qty Days # of Refill Sched Payment Type   Paulean Lamotte [address], [place], [address]   [name] [id number]  [facility] [id number] [phone] 01/20/2023 01/20/2023 01/21/2023 46962 Suboxone Film ACTIVE 8 mg/1; 2 mg/1 56 28 0/0 CIII Medicaid   [name] [id number]  [facility] [id number] [phone] 12/23/2022 12/23/2022 12/24/2022 83234 Suboxone Film INACTIVE 8 mg/1; 2 mg/1 56 28 0/0 CIII Medicaid   [name] [id number] [address] [facility] [id number] [phone] 11/26/2022 11/26/2022 11/26/2022 41660 Suboxone Film INACTIVE 8 mg/1; 2 mg/1 54 27 0/0 CIII Medicaid            [name], CASE MANAGER

## 2023-02-12 ENCOUNTER — Encounter (INDEPENDENT_AMBULATORY_CARE_PROVIDER_SITE_OTHER): Payer: Self-pay

## 2023-02-17 ENCOUNTER — Ambulatory Visit (INDEPENDENT_AMBULATORY_CARE_PROVIDER_SITE_OTHER): Payer: MEDICAID | Admitting: Clinical

## 2023-02-17 ENCOUNTER — Ambulatory Visit (INDEPENDENT_AMBULATORY_CARE_PROVIDER_SITE_OTHER): Payer: MEDICAID | Admitting: Family

## 2023-02-18 ENCOUNTER — Other Ambulatory Visit: Payer: Self-pay

## 2023-02-18 ENCOUNTER — Telehealth (HOSPITAL_COMMUNITY): Payer: MEDICAID | Admitting: Psychiatry

## 2023-02-18 DIAGNOSIS — F1120 Opioid dependence, uncomplicated: Secondary | ICD-10-CM

## 2023-02-18 MED ORDER — BUPRENORPHINE 8 MG-NALOXONE 2 MG SUBLINGUAL FILM
1.0000 | ORAL_FILM | Freq: Two times a day (BID) | SUBLINGUAL | 0 refills | Status: DC
Start: 2023-02-18 — End: 2023-03-17

## 2023-02-18 NOTE — Progress Notes (Signed)
COAT TELE-Clinic Progress Note  TELEMEDICINE DOCUMENTATION:    Patient Location:  [address] [address]   Patient/family aware of provider location:  YES  Patient/family consent for telemedicine:  YES    The group modality was facilitated via Zoom.     The patient was seen as part of a collaborative telemedicine service with their COAT treatment team who participated in the encounter by active presence via approved video/audio means for portions of the encounter.  This clinic visit included medication management, addiction education, and evaluation of progress in recovery.     Examination observed and performed by:  Allayne Gitelman. Allyson Sabal, DO COAT Clinic Tele-Video-Audio Progress Note  Patient Name:  Cheryl [name]  MRN: [id number]  Date: 02/18/2023   Chief Complaint: "Addiction"  Current MAT: Buprenorphine   16 mg  Subjective:    Patient has been sober for 521 days.    Patient presents for a Make-Up COAT Clinic encounter. Brought son in for dental work yesterday so doing make up group today. She has been doing "pretty good." Would like to be put in touch with Otho Ket to help with some custody issues.   Has reduced vaping to one vape lasting 3 weeks. Encouraged to call the Quit Line NRT.   Cravings-None   Withdrawal Symptoms- None  Side Effects-None    Motivation is good    General Exam:  Patient is in no acute distress  alert and oriented      Mental Status:  Speech is clear and coherent.  Thought process is linear.  No indication of responding to internal stimuli.  Affect is stable  Attention is sustained  Concentration is sustained  Insight is good    Assessment:  Opioid Dependence 304.9 - Stable ; TUD - active.       Plan:  Continue COAT Clinic and medication. NRT for TUD.    Coordination of Care:  Coordination of Care:  Patient was seen in the COAT Clinic in the Department of Behavioral Medicine and Psychiatry at [facility] in [place], [place]. This clinic visit included medication management,  addiction education, and evaluation of progress in recovery in a group setting. Patient's progress, psychosocial functioning and treatment response was discussed during the treatment team meeting with the case manager and/or therapist to assist in medical decision-making.    [name], DO 02/18/2023

## 2023-02-19 ENCOUNTER — Encounter (HOSPITAL_COMMUNITY): Payer: Self-pay

## 2023-02-19 NOTE — Progress Notes (Signed)
New referral. ROI and intro sent with availability to schedule an intake.

## 2023-02-20 ENCOUNTER — Encounter (HOSPITAL_COMMUNITY): Payer: Self-pay

## 2023-02-20 NOTE — Progress Notes (Signed)
Intake with [facility] scheduled for 02/24/2023 at 1:00pm

## 2023-02-24 ENCOUNTER — Encounter (HOSPITAL_COMMUNITY): Payer: Self-pay

## 2023-02-24 NOTE — Telephone Encounter (Signed)
Pt attended intake. Children were adopted after termination of parental rights. Case was closed. Maternal mother has custody of oldest child where pt lives. Two younger children were adopted out to individual who is not related. Pt has not been able to see children in 2 years.

## 2023-02-28 ENCOUNTER — Encounter (HOSPITAL_COMMUNITY): Payer: Self-pay

## 2023-02-28 NOTE — Progress Notes (Signed)
[facility]  [place] Cont[place] Full Name Report Report Date 02/28/2023   From 11/28/2022 To 02/27/2023 Date of Birth 07/21/1985 Prescription Count 3   Last Name [name] First Name Cheryl Middle Name                                      Patients included in report that appear to match the search criteria.   Last Name First Name Middle Name Gender Address   [place]   F [address] , [place], [place], [address]            Prescriber Name Prescriber DEA & Zip Dispenser Name Dispenser DEA & Zip Rx Written Date Rx Dispense Date  & Date Sold    Rx Number Product Name MEDD Status Strength Qty Days # of Refill Sched Payment Type   Dibbie Maddy [address], [place], [address]   [name] [id number] [phone] [facility] [id number] [phone] 02/18/2023 02/18/2023 02/18/2023 [id number] Suboxone Film ACTIVE 8 mg/1; 2 mg/1 56 28 0/0 CIII Medicaid   [name] [id number]  [facility] [id number] [phone] 01/20/2023 01/20/2023 01/21/2023 59563 Suboxone Film INACTIVE 8 mg/1; 2 mg/1 56 28 0/0 CIII Medicaid   [name] [id number]  [facility] [id number] [phone] 12/23/2022 12/23/2022 12/24/2022 [id number] Suboxone Film INACTIVE 8 mg/1; 2 mg/1 56 28 0/0 CIII Medicaid            [name], CASE MANAGER

## 2023-03-14 ENCOUNTER — Encounter (HOSPITAL_COMMUNITY): Payer: Self-pay

## 2023-03-17 ENCOUNTER — Telehealth: Payer: MEDICAID | Admitting: FAMILY PRACTICE

## 2023-03-17 ENCOUNTER — Telehealth: Payer: MEDICAID | Admitting: Emergency Medicine

## 2023-03-17 ENCOUNTER — Other Ambulatory Visit: Payer: Self-pay

## 2023-03-17 DIAGNOSIS — F17200 Nicotine dependence, unspecified, uncomplicated: Secondary | ICD-10-CM

## 2023-03-17 DIAGNOSIS — F1121 Opioid dependence, in remission: Secondary | ICD-10-CM

## 2023-03-17 DIAGNOSIS — F1120 Opioid dependence, uncomplicated: Secondary | ICD-10-CM

## 2023-03-17 MED ORDER — BUPRENORPHINE 8 MG-NALOXONE 2 MG SUBLINGUAL FILM
1.0000 | ORAL_FILM | Freq: Two times a day (BID) | SUBLINGUAL | 0 refills | Status: DC
Start: 2023-03-17 — End: 2023-04-14

## 2023-03-17 NOTE — Progress Notes (Signed)
Medicine  Department of Behavioral Medicine Outpatient  Comprehensive Opioid Addiction Treatment (COAT) Program  Monthly Group Progress Note    TELEMEDICINE DOCUMENTATION:    Patient Location:  Zoom Visit from [address] [address]  Patient/family aware of provider location:  Yes  Patient/family consent for telemedicine:  Yes  Examination observed and performed by: [name], MD    Video Visit, via Zoom      Patient Name:  Cheryl [name]  MRN: [id number]  Date: 03/17/2023   Payor Source: Payor: [employer] - [place] / Plan: [employer] BETTER [employer] - [place] / Product Type: Medicaid MC /   Peer Recovery Support Specialist present during group: no    Chief Concern: "Follow up Med Check for Substance Use Disorder"    Current MOUD:  Buprenorphine Dose: 16 mg/daily       Subjective:    Patient reports 554 clinic days.  Doing well, has medicaid until April and was told she could not get other insurance until then.   Recovery stable.  No other concerns.    Cravings: None  Withdrawal Symptoms: None  Side Effects: none    Motivation: Good      Objective:  Mood: within normal limits    Affect: stable     Speech: normal rate/ clear/ coherent    Thought process: Goal directed/Linear     Attention: maintained and interactive     Appearance: Alert, Receptive, Oriented , and No acute distress      Insight: Good      Urine drug screen:      07/10/2021     9:00 AM 08/31/2021     2:00 PM 10/05/2021     9:00 AM 11/27/2021    12:47 PM 12/28/2021     1:00 PM 02/15/2022     9:00 AM 05/09/2022    12:00 PM   Drug Screen Results   Amphetamine (AMP) Negative Negative Negative Negative Negative Negative Negative   Barbiturates (BAR) Negative Negative Negative Negative Negative Negative Negative   BUP - Cut off Levels 10 ng/ml Positive Positive Positive Positive Positive Positive Positive   Benzodiazepine (BZO) Negative Negative Negative Negative Negative Negative Negative   Cocaine (COC) Negative Negative Negative Negative Negative Negative  Negative   Methamphetamine (MET) Negative Negative Negative Negative Negative Negative Negative   Methadone (MTD) Negative Negative Negative Negative Negative Negative Negative   Oxycodone (OXY) Negative Negative Negative Negative Negative Negative Negative   Marijuana (THC) Negative Negative Negative Negative Negative Negative Negative   Ecstasy (MDMA) Negative Negative Negative Negative Negative Negative Negative   Phencyclidine (PCP) Negative Negative Negative Negative Negative Negative Negative   Tricyclic Antidepressant (TCA)   Negative Negative Negative Negative Negative   Temperature within range? yes yes yes yes yes yes yes   Observed no yes no no no yes no   Tester Lennox Pippins Jk AK Ciara Ciara Ciara   Physician [address] [place] [employer]   Lot # [id number] [phone] [id number] [phone] [id number] [phone] [id number]   Expiration Date 06/20/2022 06/20/2022 04/27/2023 04/27/23 04/20/2023 04/20/2023 09/04/2023   Internal Control Valid yes yes yes yes yes yes yes   Initials CND CND Jk ak CND CND CND       Assessment:  Opioid Use Disorder, in sustained remission, on maintenance therapy     Other SUD Diagnoses: None  Procedure: Patient was seen in the COAT Clinic in the Department of Behavioral Medicine and Psychiatry in [place], [place]. This  clinic visit included medication management, addiction education, and evaluation of progress in recovery in a group setting. Patient's progress, psychosocial functioning and treatment response was discussed during the treatment team meeting with the case manager and/or therapist to assist in medical decision-making.   Plan:  Return for next scheduled appointment in COAT Clinic  Continue recovery work, including peer recovery meetings.  Continue Current MOUD: Continue previously prescribed  8/2 mg Buprenorphine-naloxone (Suboxone) Films , Buprenorphine Dose:16 mg/daily .    [name], MD

## 2023-03-17 NOTE — Progress Notes (Signed)
[facility]  Department of Behavioral Medicine Outpatient  Comprehensive Opioid Addiction Treatment (COAT) Program  Monthly Group Therapy Note    TELEMEDICINE DOCUMENTATION:    Patient Location: Zoom Visit from [address] [address]   Patient/family aware of provider location:  Yes  Patient/family consent for telemedicine:  Yes  Examination observed and performed by: Maralyn Sago, Licensed Professional Counselor      Video Visit, via Zoom     Patient Name:  Cheryl [name]  MRN: [id number]  Date: 03/17/2023   Payor Source: Payor: [employer] - [place] / Plan: [employer] BETTER HE[employer] - [place] / Product Type: Medicaid [place] /   CPT Code: [phone]  Time: 9:35 a.m. to 10:30 a.m.  Number in Group: 8  Peer Recovery Support Specialist present during group: no    Chief Concern: "Group Therapy for Substance Use Disorder"      Subjective:   Anagabriela presents today for group therapy through the Comprehensive Opioid Addiction Treatment (COAT) program.  She reports 548 clinic days. The purpose of this group was to discuss the importance of self-care for caregivers. She specifically discussed how busy she is in taking care of her child as well as her mom, and has been setting boundaries with her time.    Objective:   Mood: within normal limits  Affect: congruent to mood   Appearance/Behavior: Alert, Good eye contact, Good participation, Receptive, Supportive of peers, Oriented , Dressed appropriately , and No acute distress   Participation:  active and attentive      Assessment:  Opioid Use Disorder, in sustained remission, on maintenance therapy   Other SUD Diagnoses: Tobacco Use Disorder  Procedure:   This is a Monthly  process and psychoeducational group designed to be appropriate to individuals with opioid use disorder in recovery. The focus of this group to educate about the process of building a sound recovery program, to facilitate the understanding of the disease of addiction and how Medication Assisted Treatment (MAT)  works to aid in the development of recovery, and to support the practice of new recovery tools. Attendance and experience at peer recovery meetings was reviewed. Patients were encouraged to identify triggers for relapse, employ relapse prevention strategies and to develop a supportive social network.    Plan:  Maintain abstinence, follow recommendations of Treatment Team, Continue attending the COAT Clinic group and take medication as prescribed. Attend the encouraged peer recovery meetings.    Maralyn Sago, Licensed Professional Counselor  03/17/2023, 15:36

## 2023-03-25 ENCOUNTER — Encounter (HOSPITAL_COMMUNITY): Payer: Self-pay

## 2023-03-25 NOTE — Progress Notes (Signed)
[facility]  [place] Full Name Report Report Date 03/25/2023   From 12/24/2022 To 03/24/2023 Date of Birth 07-29-1985 Prescription Count 3   Last Name Crichlow First Name Fronnie Middle Name                                      Patients included in report that appear to match the search criteria.   Last Name First Name Middle Name Gender Address   [place]   F [address] , [place], [place], [address]            Prescriber Name Prescriber DEA & Zip Dispenser Name Dispenser DEA & Zip Rx Written Date Rx Dispense Date  & Date Sold    Rx Number Product Name MEDD Status Strength Qty Days # of Refill Sched Payment Type   Sharrah Brusseau [address], [place], [address]   [name] [id number]  [facility] [id number] [phone] 03/17/2023 03/17/2023 03/17/2023 17510 Suboxone Film ACTIVE 8 mg/1; 2 mg/1 56 28 0/0 CIII Medicaid   [name] [id number] [facility] [id number] [phone] 02/18/2023 02/18/2023 02/18/2023 08676 Suboxone Film INACTIVE 8 mg/1; 2 mg/1 56 28 0/0 CIII Medicaid   [name] [id number]  [facility] [id number] [phone] 01/20/2023 01/20/2023 01/21/2023 05397 Suboxone Film INACTIVE 8 mg/1; 2 mg/1 56 28 0/0 CIII Medicaid            [name], CASE MANAGER

## 2023-04-11 ENCOUNTER — Encounter (HOSPITAL_COMMUNITY): Payer: Self-pay

## 2023-04-11 NOTE — Progress Notes (Signed)
SS sent to pt

## 2023-04-14 ENCOUNTER — Telehealth: Payer: MEDICAID | Admitting: Counselor - Addiction

## 2023-04-14 ENCOUNTER — Telehealth (INDEPENDENT_AMBULATORY_CARE_PROVIDER_SITE_OTHER): Payer: MEDICAID | Admitting: Emergency Medicine

## 2023-04-14 DIAGNOSIS — F1120 Opioid dependence, uncomplicated: Secondary | ICD-10-CM

## 2023-04-14 MED ORDER — BUPRENORPHINE 8 MG-NALOXONE 2 MG SUBLINGUAL FILM
1.0000 | ORAL_FILM | Freq: Two times a day (BID) | SUBLINGUAL | 0 refills | Status: DC
Start: 2023-04-14 — End: 2023-05-11

## 2023-04-14 NOTE — Progress Notes (Signed)
[facility]  Department of Behavioral Medicine Outpatient  Comprehensive Opioid Addiction Treatment (COAT) Program  Monthly Group Progress Note    TELEMEDICINE DOCUMENTATION:    Patient Location:  Zoom Visit from [address] [address]  Patient/family aware of provider location:  Yes  Patient/family consent for telemedicine:  Yes  Examination observed and performed by: [name], MD    Video Visit, via Zoom      Patient Name:  Cheryl [name]  MRN: [id number]  Date: 04/14/2023   Payor Source: Payor: [employer] BETTER [employer] - [place] / Plan: [employer] BETTER HE[employer] - [place] / Product Type: Medicaid [place] /   Peer Recovery Support Specialist present during group: no    Chief Concern: "Follow up Med Check for Substance Use Disorder"    Current MOUD:  Buprenorphine Dose: 16 mg/daily       Subjective:    Patient reports 582 clinic days.  Doing well, had a great holiday.  Found a program that will reduce cost of Suboxone when insurance switches in April (new insurance does not cover Suboxone).   Recovery stable.  No other concerns.    Cravings: None  Withdrawal Symptoms: None  Side Effects: none    Motivation: Good      Objective:  Mood: within normal limits    Affect: stable     Speech: normal rate/ clear/ coherent    Thought process: Goal directed/Linear     Attention: maintained and interactive     Appearance: Alert, Receptive, Oriented , and No acute distress      Insight: Good      Urine drug screen:      07/10/2021     9:00 AM 08/31/2021     2:00 PM 10/05/2021     9:00 AM 11/27/2021    12:47 PM 12/28/2021     1:00 PM 02/15/2022     9:00 AM 05/09/2022    12:00 PM   Drug Screen Results   Amphetamine (AMP) Negative Negative Negative Negative Negative Negative Negative   Barbiturates (BAR) Negative Negative Negative Negative Negative Negative Negative   BUP - Cut off Levels 10 ng/ml Positive Positive Positive Positive Positive Positive Positive   Benzodiazepine (BZO) Negative Negative Negative Negative Negative Negative Negative   Cocaine  (COC) Negative Negative Negative Negative Negative Negative Negative   Methamphetamine (MET) Negative Negative Negative Negative Negative Negative Negative   Methadone (MTD) Negative Negative Negative Negative Negative Negative Negative   Oxycodone (OXY) Negative Negative Negative Negative Negative Negative Negative   Marijuana (THC) Negative Negative Negative Negative Negative Negative Negative   Ecstasy (MDMA) Negative Negative Negative Negative Negative Negative Negative   Phencyclidine (PCP) Negative Negative Negative Negative Negative Negative Negative   Tricyclic Antidepressant (TCA)   Negative Negative Negative Negative Negative   Temperature within range? yes yes yes yes yes yes yes   Observed no yes no no no yes no   Tester Lennox Pippins Jk AK Ciara Ciara Ciara   Physician [address] [employer] [employer]   Lot # [id number] [id number] [phone] [id number] [phone] [id number]   Expiration Date 06/20/2022 06/20/2022 04/27/2023 04/27/23 04/20/2023 04/20/2023 09/04/2023   Internal Control Valid yes yes yes yes yes yes yes   Initials CND CND Jk ak CND CND CND       Assessment:  Opioid Use Disorder, in sustained remission, on maintenance therapy     Other SUD Diagnoses: None  Procedure: Patient was seen in the COAT Clinic in the  Department of Behavioral Medicine and Psychiatry in [place], [place]. This clinic visit included medication management, addiction education, and evaluation of progress in recovery in a group setting. Patient's progress, psychosocial functioning and treatment response was discussed during the treatment team meeting with the case manager and/or therapist to assist in medical decision-making.   Plan:  Return for next scheduled appointment in COAT Clinic  Continue recovery work, including peer recovery meetings.  Continue Current MOUD: Continue previously prescribed  8/2 mg Buprenorphine-naloxone (Suboxone) Films , Buprenorphine Dose:16 mg/daily .    [name],  MD

## 2023-04-17 NOTE — Progress Notes (Signed)
TELEMEDICINE DOCUMENTATION:    Patient Location: [place]  Patient/family aware of provider location:  Yes  Patient/family consent for telemedicine:  Yes  Examination observed and performed by:  Ricky L. Wilfrid Lund, [facility]    I personally offered the service to the patient, and obtained verbal consent to provide this service.    Kipp Laurence, [facility]    This service occurred via video.     COAT GROUP PROGRESS NOTE    GROUP THERAPY    NAME: TNIA SZOSTEK  Date of Service: 04/14/2023  TIME: 9:30 am to 10:30 am  DURATION: 60 minutes  CPT CODE: 95638  CHIEF COMPLAINT: Group Therapy for Addiction  NUMBER IN GROUP: 8    Subjective: Patient seen for group therapy.  Patient has 576 days without the use of substances and has attended 0 required meetings.     This was a Monthly COAT Group by using Telemedicine.  Group focused on reviewing and discussing the 10 Principles of recovery.  We talked about the 10 principles of recovery and how to strengthen their recovery for the New Year.  Lucianna discussed what principles she is using to help with her recovery.  She talked about how she is working and how it helps with her recovery.       Observation:  Mood: Pleasant   Affect: Congruent   Thought processes: Logical  Participation: Patient participated willingly in group.         Assessment/Diagnosis: Opioid Use Disorder, with dependence, on maintenance therapy (F11.20)    Procedure: Patient attended the Bi-Weekly COAT Group. The focus of this group to educate about the process of  building a sound recovery program, to facilitate the understanding of the disease of addiction and how Suboxone works to aid in the development of recovery, and to support the practice of new recovery tools. Encouragement of attendance at 12 step recovery meetings, identification of triggers for use/ relapse and the development of a supportive social network were also covered.    Plan: Abstinence, take suboxone as prescribed, attend at least 4 NA/AA meetings a  week (as required) and return to clinic as scheduled.      Larose Hires, [facility]  Clinical Therapist  [place] Department of Behavioral Medicine and Psychiatry     I reviewed the clinician's note and agree with the therapeutic intervention and plan as documented.    [name], MD  Attending Physician

## 2023-05-03 ENCOUNTER — Encounter (HOSPITAL_COMMUNITY): Payer: Self-pay

## 2023-05-03 NOTE — Progress Notes (Signed)
[facility]  [place] Full Name Report Report Date 05/03/2023   From 01/31/2023 To 05/02/2023 Date of Birth Aug 11, 1985 Prescription Count 3   Last Name [name] First Name Cheryl Middle Name                                      Patients included in report that appear to match the search criteria.   Last Name First Name Middle Name Gender Address   [place]   F [address] , [place], [place], [address]            Prescriber Name Prescriber DEA & Zip Dispenser Name Dispenser DEA & Zip Rx Written Date Rx Dispense Date  & Date Sold    Rx Number Product Name MEDD Status Strength Qty Days # of Refill Sched Payment Type   Terilynn Buresh [address], [place], [address]   [name] [id number]  [facility] [id number] [phone] 04/14/2023 04/14/2023 04/15/2023 95501 Suboxone Film ACTIVE 8 mg/1; 2 mg/1 56 28 0/0 CIII Medicaid   [name] [id number]  [facility] [id number] [phone] 03/17/2023 03/17/2023 03/17/2023 66440 Suboxone Film INACTIVE 8 mg/1; 2 mg/1 56 28 0/0 CIII Medicaid   [name] [id number] [address] [facility] [id number] [phone] 02/18/2023 02/18/2023 02/18/2023 41660 Suboxone Film INACTIVE 8 mg/1; 2 mg/1 56 28 0/0 CIII Medicaid            [name], CASE MANAGER

## 2023-05-11 NOTE — Progress Notes (Unsigned)
[facility]  Department of Behavioral Medicine Outpatient  Comprehensive Opioid Addiction Treatment (COAT) Program  Monthly Group Progress Note    TELEMEDICINE DOCUMENTATION:    Patient Location:  Zoom Visit from [address] [address]  Patient/family aware of provider location:  Yes  Patient/family consent for telemedicine:  Yes  Examination observed and performed by: [name], MD    Video Visit, via Zoom      Patient Name:  Cheryl [name]  MRN: [id number]  Date: 05/12/2023   Payor Source: Payor: [employer] - [place] / Plan: [employer] BETTER [employer] - [place] / Product Type: Medicaid [place] /   Peer Recovery Support Specialist present during group: no    Chief Concern: "Follow up Med Check for Substance Use Disorder"    Current MOUD:  Buprenorphine Dose: 16 mg/daily       Subjective:    Patient reports 604 clinic days.  Doing well, work was slow in December but picked up again last month, prefers to be busy.  Was cutting back on vaping nicotine, stress this past month led her to resume usual level of use.  Vape lasting 8-10 days.  Had issue when calling quit line, encouraged to try again.   Recovery stable.  No other concerns.    Cravings: None  Withdrawal Symptoms: None  Side Effects: none    Motivation: Good      Objective:  Mood: within normal limits    Affect: stable     Speech: normal rate/ clear/ coherent    Thought process: Goal directed/Linear     Attention: maintained and interactive     Appearance: Alert, Receptive, Oriented , and No acute distress      Insight: Good      Urine drug screen:      07/10/2021     9:00 AM 08/31/2021     2:00 PM 10/05/2021     9:00 AM 11/27/2021    12:47 PM 12/28/2021     1:00 PM 02/15/2022     9:00 AM 05/09/2022    12:00 PM   Drug Screen Results   Amphetamine (AMP) Negative Negative Negative Negative Negative Negative Negative   Barbiturates (BAR) Negative Negative Negative Negative Negative Negative Negative   BUP - Cut off Levels 10 ng/ml Positive Positive Positive Positive  Positive Positive Positive   Benzodiazepine (BZO) Negative Negative Negative Negative Negative Negative Negative   Cocaine (COC) Negative Negative Negative Negative Negative Negative Negative   Methamphetamine (MET) Negative Negative Negative Negative Negative Negative Negative   Methadone (MTD) Negative Negative Negative Negative Negative Negative Negative   Oxycodone (OXY) Negative Negative Negative Negative Negative Negative Negative   Marijuana (THC) Negative Negative Negative Negative Negative Negative Negative   Ecstasy (MDMA) Negative Negative Negative Negative Negative Negative Negative   Phencyclidine (PCP) Negative Negative Negative Negative Negative Negative Negative   Tricyclic Antidepressant (TCA)   Negative Negative Negative Negative Negative   Temperature within range? yes yes yes yes yes yes yes   Observed no yes no no no yes no   Tester Lennox Pippins Jk AK Ciara Ciara Ciara   Physician [address] [place] [employer]   Lot # [id number] [id number] [phone] [id number]   Expiration Date 06/20/2022 06/20/2022 04/27/2023 04/27/23 04/20/2023 04/20/2023 09/04/2023   Internal Control Valid yes yes yes yes yes yes yes   Initials CND CND Jk ak CND CND CND       Assessment:  Opioid Use Disorder,  in sustained remission, on maintenance therapy     Other SUD Diagnoses: None  Procedure: Patient was seen in the COAT Clinic in the Department of Behavioral Medicine and Psychiatry in [place], [place]. This clinic visit included medication management, addiction education, and evaluation of progress in recovery in a group setting. Patient's progress, psychosocial functioning and treatment response was discussed during the treatment team meeting with the case manager and/or therapist to assist in medical decision-making.   Plan:  Return for next scheduled appointment in COAT Clinic  Continue recovery work, including peer recovery meetings.  Continue Current MOUD: Continue  previously prescribed  8/2 mg Buprenorphine-naloxone (Suboxone) Films , Buprenorphine Dose:16 mg/daily .    [name], MD

## 2023-05-12 ENCOUNTER — Telehealth (INDEPENDENT_AMBULATORY_CARE_PROVIDER_SITE_OTHER): Payer: MEDICAID | Admitting: Emergency Medicine

## 2023-05-12 ENCOUNTER — Other Ambulatory Visit: Payer: Self-pay

## 2023-05-12 ENCOUNTER — Telehealth (INDEPENDENT_AMBULATORY_CARE_PROVIDER_SITE_OTHER): Payer: MEDICAID | Admitting: Clinical

## 2023-05-12 ENCOUNTER — Encounter (INDEPENDENT_AMBULATORY_CARE_PROVIDER_SITE_OTHER): Payer: Self-pay

## 2023-05-12 DIAGNOSIS — F1120 Opioid dependence, uncomplicated: Secondary | ICD-10-CM

## 2023-05-12 DIAGNOSIS — F1121 Opioid dependence, in remission: Secondary | ICD-10-CM

## 2023-05-12 MED ORDER — BUPRENORPHINE 8 MG-NALOXONE 2 MG SUBLINGUAL FILM
1.0000 | ORAL_FILM | Freq: Two times a day (BID) | SUBLINGUAL | 0 refills | Status: AC
Start: 2023-05-12 — End: ?

## 2023-05-12 NOTE — Nursing Note (Signed)
Person notified to present to [facility], [place], [place], or approved outside location by EOD on 05/14/23 for OBSERVED (unless absolutely not possible)  urine drug screen.  Pt to be sent out for ETGR, GABA, BUP, and FENT    Pt reported 604 with No Relapse      Patient was educated to contact case manager to inquire about [facility] Medicine affiliate locations.    Gita Kudo, MA  05/12/2023, 08:37

## 2023-05-14 ENCOUNTER — Other Ambulatory Visit: Payer: Self-pay

## 2023-05-14 ENCOUNTER — Ambulatory Visit: Payer: MEDICAID | Attending: Emergency Medicine

## 2023-05-14 DIAGNOSIS — F1120 Opioid dependence, uncomplicated: Secondary | ICD-10-CM | POA: Insufficient documentation

## 2023-05-14 LAB — DRUG SCREEN, WITH CONFIRMATION, URINE
AMPHETAMINES URINE: NEGATIVE
BARBITURATES URINE: NEGATIVE
BENZODIAZEPINES URINE: NEGATIVE
BUPRENORPHINE URINE: POSITIVE — AB
CANNABINOIDS URINE: NEGATIVE
COCAINE METABOLITES URINE: NEGATIVE
FENTANYL, URINE: NEGATIVE
METHADONE URINE: NEGATIVE
OPIATES URINE: NEGATIVE
OXYCODONE URINE: NEGATIVE
PCP URINE: NEGATIVE

## 2023-05-14 NOTE — Psychotherapy Note (Signed)
[facility]  Department of Behavioral Medicine Outpatient  Comprehensive Opioid Addiction Treatment (COAT) Program  Monthly Group Therapy Note    TELEMEDICINE DOCUMENTATION:    Patient Location: Zoom Visit from [address] [address]   Patient/family aware of provider location:  Yes  Patient/family consent for telemedicine:  Yes  Examination observed and performed by: Baron Sane, LCSW      Video Visit, via Zoom     Patient Name:  Cheryl [name]  MRN: [id number]  Date: 05/12/2023   Payor Source: Payor: Monia Pouch [employer]TER [employer] - [place] / Plan: [employer] BETTER HEALTH - [place] / Product Type: Medicaid MC /   CPT Code: 13244  Time: 9:32-10:26  Number in Group: 8  Peer Recovery Support Specialist present during group: no    Chief Concern: "Group Therapy for Substance Use Disorder"      Subjective:   Cheryl [name] presents today for group therapy through the Comprehensive Opioid Addiction Treatment (COAT) program.  She reports 604 clinic days. The purpose of this group was to discuss current stressors and successes. This led to a conversation about purpose, specifically within a job. Therapist used Motivational Interviewing to facilitate session. . She specifically discussed peer recovery classes, and offered information to others.    Objective:   Mood: within normal limits  Affect: congruent to mood   Appearance/Behavior: Alert, Good eye contact, Good participation, Receptive, Supportive of peers, Oriented , Dressed appropriately , and No acute distress   Participation:  active and attentive      Assessment:  Opioid Use Disorder, severe, on maintenance therapy   Other SUD Diagnoses: None  Procedure:   This is a Monthly  process and psychoeducational group designed to be appropriate to individuals with opioid use disorder in recovery. The focus of this group to educate about the process of building a sound recovery program, to facilitate the understanding of the disease of addiction and how Medication Assisted Treatment (MAT) works to  aid in the development of recovery, and to support the practice of new recovery tools. Attendance and experience at peer recovery meetings was reviewed. Patients were encouraged to identify triggers for relapse, employ relapse prevention strategies and to develop a supportive social network.    Plan:  Maintain abstinence, follow recommendations of Treatment Team, Continue attending the COAT Clinic group and take medication as prescribed. Attend the encouraged peer recovery meetings.    Baron Sane, LCSW  05/14/2023, 10:10      I have read and reviewed this document. Belinda Fisher, LICSW

## 2023-05-16 LAB — CREATININE URINE, RANDOM: CREATININE RANDOM URINE: 197 mg/dL — ABNORMAL HIGH (ref 50–100)

## 2023-05-17 ENCOUNTER — Encounter (HOSPITAL_COMMUNITY): Payer: Self-pay

## 2023-05-17 NOTE — Progress Notes (Signed)
[facility]  [place] Cont[place] Full Name Report Report Date 05/17/2023   From 02/14/2023 To 05/16/2023 Date of Birth 16-Feb-1986 Prescription Count 4   Last Name [place] First Name Jimia Middle Name                                      Patients included in report that appear to match the search criteria.   Last Name First Name Middle Name Gender Address   [place]   F [address] , [place], [place], [address]            Prescriber Name Prescriber DEA & Zip Dispenser Name Dispenser DEA & Zip Rx Written Date Rx Dispense Date  & Date Sold    Rx Number Product Name MEDD Status Strength Qty Days # of Refill Sched Payment Type   Kevia Zaucha [address], [place], [address]   [name] [id number]  [facility] [id number] [phone] 05/12/2023 05/12/2023 05/12/2023 02725 Suboxone Film ACTIVE 8 mg/1; 2 mg/1 56 28 0/0 CIII Medicaid   [name] [id number]  [facility] [id number] [phone] 04/14/2023 04/14/2023 04/15/2023 95501 Suboxone Film INACTIVE 8 mg/1; 2 mg/1 56 28 0/0 CIII Medicaid   [name] [id number]  [facility] [id number] [phone] 03/17/2023 03/17/2023 03/17/2023 92791 Suboxone Film INACTIVE 8 mg/1; 2 mg/1 56 28 0/0 CIII Medicaid   [name] [id number] [facility] [id number] [phone] 02/18/2023 02/18/2023 02/18/2023 17616 Suboxone Film INACTIVE 8 mg/1; 2 mg/1 56 28 0/0 CIII Medicaid            [name], CASE MANAGER

## 2023-05-19 LAB — FENTANYL CONFIRMATORY/DEFINITIVE, URINE, BY LC-MS/MS (PERFORMABLE)
FENTANYL INTERPRETATION: NEGATIVE
FENTANYL: NOT DETECTED ng/mL (ref <0.5)
NORFENTANYL: NOT DETECTED ng/mL (ref <2)

## 2023-05-19 LAB — SUBOXONE CONFIRMATORY/DEFINITIVE, URINE, BY LC-MS/MS (PERFORMABLE)
BUPRENORPHINE: 686 ng/mL — ABNORMAL HIGH (ref <10)
NALOXONE: >1000 ng/mL — ABNORMAL HIGH (ref <5)
NORBUPRENORPHINE: >1000 ng/mL — ABNORMAL HIGH (ref <10)

## 2023-05-20 LAB — DRUG MONITORING, GABAPENTIN, QUANTITATIVE, URINE: GABAPENTIN: NEGATIVE ng/mL (ref <1000)

## 2023-05-20 LAB — NOTES AND COMMENTS

## 2023-05-21 LAB — ETHYL GLUCURONIDE & ETHYL SULFATE (ALCOHOL METABOLITES),URINE
CREATININE RANDOM URINE: 197 mg/dL (ref >=20)
ETHYL GLUCURONIDE(QUANTITATIVE): 5839 ng/mL — ABNORMAL HIGH
ETHYL SULFATE(QUANTITATIVE): 2473 ng/mL — ABNORMAL HIGH

## 2023-05-21 LAB — ETHYL GLUCURONIDE AND ETHYL SULFATE, URINE
ETHYL GLUCURONIDE(QUANTITATIVE): 5839 ng/mL — ABNORMAL HIGH
ETHYL SULFATE(QUANTITATIVE): 2473 ng/mL — ABNORMAL HIGH

## 2023-06-02 ENCOUNTER — Encounter (INDEPENDENT_AMBULATORY_CARE_PROVIDER_SITE_OTHER): Payer: Self-pay

## 2023-06-03 ENCOUNTER — Ambulatory Visit (HOSPITAL_COMMUNITY): Payer: Self-pay

## 2023-06-03 NOTE — Telephone Encounter (Signed)
 CM called patient today to discuss the confirmation of EtG and ETS levels in her screen.  Also let patient be aware that she would be moved to a higher level of care at her appointment on Monday 06/09/23.      Patient continues to deny alcohol use and is blaming the cleansing wipe used prior to giving the sample.      Esaw Grandchild, CT

## 2023-06-08 NOTE — Progress Notes (Unsigned)
 [facility]  Department of Behavioral Medicine Outpatient  Comprehensive Opioid Addiction Treatment (COAT) Program  Monthly Group Progress Note    TELEMEDICINE DOCUMENTATION:    Patient Location:  Zoom Visit from [address] [address]  Patient/family aware of provider location:  Yes  Patient/family consent for telemedicine:  Yes  Examination observed and performed by: [name], MD    Video Visit, via Zoom      Patient Name:  Cheryl [name]  MRN: [id number]  Date: 06/09/2023   Payor Source: Payor: Monia Pouch BETTER HE[employer] - [place] / Plan: Monia Pouch BETTER [employer] - [place] / Product Type: Medicaid [place] /   Peer Recovery Support Specialist present during group: no    Chief Concern: "Follow up Med Check for Substance Use Disorder"    Current MOUD:  Buprenorphine Dose: 16 mg/daily       Subjective:    Patient reports 632 clinic days.  Tested positive for ethanol biomarkers, adamantly denies using alcohol.  On questioning reports she was taking nyquil last month for numerous nights in a row, which is likely cause of positive test.  Offered to take a polygraph test, was informed that we don't trust the results of these.  Willing to have blood draw today for Peth test.  Discussed if she is not consuming alcohol then this test should be negative.  Will review results of this with her at next visit.  No other concerns.    Cravings: None  Withdrawal Symptoms: None  Side Effects: none    Motivation: Good      Objective:  Mood: within normal limits    Affect: stable     Speech: normal rate/ clear/ coherent    Thought process: Goal directed/Linear     Attention: maintained and interactive     Appearance: Alert, Receptive, Oriented , and No acute distress      Insight: Good      Urine drug screen:      07/10/2021     9:00 AM 08/31/2021     2:00 PM 10/05/2021     9:00 AM 11/27/2021    12:47 PM 12/28/2021     1:00 PM 02/15/2022     9:00 AM 05/09/2022    12:00 PM   Drug Screen Results   Amphetamine (AMP) Negative Negative Negative Negative Negative  Negative Negative   Barbiturates (BAR) Negative Negative Negative Negative Negative Negative Negative   BUP - Cut off Levels 10 ng/ml Positive Positive Positive Positive Positive Positive Positive   Benzodiazepine (BZO) Negative Negative Negative Negative Negative Negative Negative   Cocaine (COC) Negative Negative Negative Negative Negative Negative Negative   Methamphetamine (MET) Negative Negative Negative Negative Negative Negative Negative   Methadone (MTD) Negative Negative Negative Negative Negative Negative Negative   Oxycodone (OXY) Negative Negative Negative Negative Negative Negative Negative   Marijuana (THC) Negative Negative Negative Negative Negative Negative Negative   Ecstasy (MDMA) Negative Negative Negative Negative Negative Negative Negative   Phencyclidine (PCP) Negative Negative Negative Negative Negative Negative Negative   Tricyclic Antidepressant (TCA)   Negative Negative Negative Negative Negative   Temperature within range? yes yes yes yes yes yes yes   Observed no yes no no no yes no   Tester Lennox Pippins Jk AK Ciara Ciara Ciara   Physician [address] [place]   Lot # [id number] [id number]   Expiration Date 06/20/2022 06/20/2022 04/27/2023 04/27/23 04/20/2023 04/20/2023 09/04/2023   Internal Control Valid  yes yes yes yes yes yes yes   Initials CND CND Jk ak CND CND CND       Assessment:  Opioid Use Disorder, in sustained remission, on maintenance therapy     Other SUD Diagnoses: None  Procedure: Patient was seen in the COAT Clinic in the Department of Behavioral Medicine and Psychiatry in [place], [place]. This clinic visit included medication management, addiction education, and evaluation of progress in recovery in a group setting. Patient's progress, psychosocial functioning and treatment response was discussed during the treatment team meeting with the case manager and/or therapist to assist in medical  decision-making.   Plan:  Return for next scheduled appointment in COAT Clinic  Continue recovery work, including peer recovery meetings.  Continue Current MOUD: Continue previously prescribed  8/2 mg Buprenorphine-naloxone (Suboxone) Films , Buprenorphine Dose:16 mg/daily .    [name], MD

## 2023-06-09 ENCOUNTER — Telehealth: Payer: Self-pay | Admitting: Emergency Medicine

## 2023-06-09 ENCOUNTER — Other Ambulatory Visit: Payer: Self-pay

## 2023-06-09 ENCOUNTER — Other Ambulatory Visit: Payer: MEDICAID | Attending: Emergency Medicine | Admitting: Emergency Medicine

## 2023-06-09 ENCOUNTER — Ambulatory Visit (INDEPENDENT_AMBULATORY_CARE_PROVIDER_SITE_OTHER): Payer: MEDICAID | Admitting: Clinical

## 2023-06-09 ENCOUNTER — Ambulatory Visit (INDEPENDENT_AMBULATORY_CARE_PROVIDER_SITE_OTHER): Payer: MEDICAID

## 2023-06-09 DIAGNOSIS — F1120 Opioid dependence, uncomplicated: Secondary | ICD-10-CM

## 2023-06-09 MED ORDER — BUPRENORPHINE 8 MG-NALOXONE 2 MG SUBLINGUAL FILM
1.0000 | ORAL_FILM | Freq: Two times a day (BID) | SUBLINGUAL | 0 refills | Status: DC
Start: 2023-06-09 — End: 2023-07-06

## 2023-06-09 NOTE — Nursing Note (Signed)
 Date of venipuncture: 06/09/2023  Time of venipuncture: 12:48  Site: Right antecubital  Patient tolerance: tolerated well    Comments:  Pt stuck twice, first attempt by CAW Left antecubital, blood collected by CMW on right antecubital.   Vernell Barrier, Ambulatory Care Assistant

## 2023-06-14 LAB — DRUG MONITORING, PHOSPHATIDYLETHANOL (PETH), BLOOD
PEth 16:0/18:1 (POPEth): NEGATIVE ng/mL (ref <20)
PEth 16:0/18:2 (PLPEth): NEGATIVE ng/mL (ref <20)

## 2023-06-16 ENCOUNTER — Encounter (INDEPENDENT_AMBULATORY_CARE_PROVIDER_SITE_OTHER): Payer: Self-pay | Admitting: Emergency Medicine

## 2023-06-17 ENCOUNTER — Encounter (HOSPITAL_COMMUNITY): Payer: Self-pay

## 2023-06-17 NOTE — Progress Notes (Signed)
 [facility]  [place] Full Name Report Report Date 06/17/2023   From 03/19/2023 To 06/16/2023 Date of Birth [birth year] Prescription Count 3   Last Name Carolan First Name Nazirah Middle Name                                      Patients included in report that appear to match the search criteria.   Last Name First Name Middle Name Gender Address   [place]   F [address] , [place], [place], [address]            Prescriber Name Prescriber DEA & Zip Dispenser Name Dispenser DEA & Zip Rx Written Date Rx Dispense Date  & Date Sold    Rx Number Product Name MEDD Status Strength Qty Days # of Refill Sched Payment Type   Samiyah Stupka [address], [place], [address]   [name] [id number]  [facility] [id number] 06/09/2023 06/09/2023 06/10/2023 [id number] Suboxone Film ACTIVE 8 mg/1; 2 mg/1 56 28 0/0 CIII Medicaid   [name] [id number]  [facility] [id number] [phone] 05/12/2023 05/12/2023 05/12/2023 60630 Suboxone Film INACTIVE 8 mg/1; 2 mg/1 56 28 0/0 CIII Medicaid   [name] [id number]  [facility] [id number] [phone] 04/14/2023 04/14/2023 04/15/2023 95501 Suboxone Film INACTIVE 8 mg/1; 2 mg/1 56 28 0/0 CIII Medicaid            [name], CASE MANAGER

## 2023-07-06 NOTE — Progress Notes (Signed)
 [facility]  Department of Behavioral Medicine Outpatient  Comprehensive Opioid Addiction Treatment (COAT) Program  Monthly Group Progress Note    TELEMEDICINE DOCUMENTATION:    Patient Location:  Zoom Visit from [address] [address]  Patient/family aware of provider location:  Yes  Patient/family consent for telemedicine:  Yes  Examination observed and performed by: Hillman Luck, MD    Video Visit, via Zoom      Patient Name:  Cheryl [name]  MRN: [id number]  Date: 07/07/2023   Payor Source: Payor: Raina Bunting BETTER HE[employer] - [place] / Plan: Raina Bunting BETTER [employer] - [place] / Product Type: Medicaid [place] /   Peer Recovery Support Specialist present during group: no    Chief Concern: "Follow up Med Check for Substance Use Disorder"    Current MOUD:  Buprenorphine  Dose: 16 mg/daily       Subjective:    Patient reports 660 clinic days.  Will likely get a chance to see her 39 year old son, has not seen him since age 36.  CPS advocate helped to arrance connection, very grateful.  No other concerns.    Cravings: None  Withdrawal Symptoms: None  Side Effects: none    Motivation: Good      Objective:  Mood: within normal limits    Affect: stable     Speech: normal rate/ clear/ coherent    Thought process: Goal directed/Linear     Attention: maintained and interactive     Appearance: Alert, Receptive, Oriented , and No acute distress      Insight: Good      Urine drug screen:      07/10/2021     9:00 AM 08/31/2021     2:00 PM 10/05/2021     9:00 AM 11/27/2021    12:47 PM 12/28/2021     1:00 PM 02/15/2022     9:00 AM 05/09/2022    12:00 PM   Drug Screen Results   Amphetamine (AMP) Negative Negative Negative Negative Negative Negative Negative   Barbiturates (BAR) Negative Negative Negative Negative Negative Negative Negative   BUP - Cut off Levels 10 ng/ml Positive Positive Positive Positive Positive Positive Positive   Benzodiazepine (BZO) Negative Negative Negative Negative Negative Negative Negative   Cocaine (COC) Negative Negative Negative  Negative Negative Negative Negative   Methamphetamine (MET) Negative Negative Negative Negative Negative Negative Negative   Methadone (MTD) Negative Negative Negative Negative Negative Negative Negative   Oxycodone (OXY) Negative Negative Negative Negative Negative Negative Negative   Marijuana (THC) Negative Negative Negative Negative Negative Negative Negative   Ecstasy (MDMA) Negative Negative Negative Negative Negative Negative Negative   Phencyclidine (PCP) Negative Negative Negative Negative Negative Negative Negative   Tricyclic Antidepressant (TCA)   Negative Negative Negative Negative Negative   Temperature within range? yes yes yes yes yes yes yes   Observed no yes no no no yes no   Tester Michaela Adie Jk AK Ciara Ciara Ciara   Physician [address] [place]   Lot # [id number] [id number] [phone] [id number] [phone] [id number]   Expiration Date 06/20/2022 06/20/2022 04/27/2023 04/27/23 04/20/2023 04/20/2023 09/04/2023   Internal Control Valid yes yes yes yes yes yes yes   Initials CND CND Jk ak CND CND CND       Assessment:  Opioid Use Disorder, in sustained remission, on maintenance therapy     Other SUD Diagnoses: None  Procedure: Patient was seen in the COAT Clinic in the Department of Behavioral  Medicine and Psychiatry in [place], [place]. This clinic visit included medication management, addiction education, and evaluation of progress in recovery in a group setting. Patient's progress, psychosocial functioning and treatment response was discussed during the treatment team meeting with the case manager and/or therapist to assist in medical decision-making.   Plan:  Return for next scheduled appointment in COAT Clinic  Continue recovery work, including peer recovery meetings.  Continue Current MOUD: Continue previously prescribed  8/2 mg Buprenorphine -naloxone  (Suboxone ) Films , Buprenorphine  Dose:16 mg/daily .    Hillman Luck, MD

## 2023-07-07 ENCOUNTER — Telehealth: Payer: MEDICAID | Admitting: Emergency Medicine

## 2023-07-07 ENCOUNTER — Telehealth (INDEPENDENT_AMBULATORY_CARE_PROVIDER_SITE_OTHER): Payer: MEDICAID | Admitting: Clinical

## 2023-07-07 ENCOUNTER — Other Ambulatory Visit: Payer: Self-pay

## 2023-07-07 DIAGNOSIS — F1120 Opioid dependence, uncomplicated: Secondary | ICD-10-CM

## 2023-07-07 MED ORDER — BUPRENORPHINE 8 MG-NALOXONE 2 MG SUBLINGUAL FILM
1.0000 | ORAL_FILM | Freq: Two times a day (BID) | SUBLINGUAL | 0 refills | Status: DC
Start: 2023-07-07 — End: 2023-08-02

## 2023-07-09 NOTE — Psychotherapy Note (Signed)
 [facility]  Department of Behavioral Medicine Outpatient  Comprehensive Opioid Addiction Treatment (COAT) Program  Monthly Group Therapy Note    TELEMEDICINE DOCUMENTATION:    Patient Location: Zoom Visit from [address] [address]   Patient/family aware of provider location:  Yes  Patient/family consent for telemedicine:  Yes  Examination observed and performed by: Deryl Flora, LCSW      Video Visit, via Zoom     Patient Name:  Cheryl [name]  MRN: [id number]  Date: 07/07/2023   Payor Source: Payor: Raina Bunting BETTER HE[employer] - [place] / Plan: [employer]ETNA BETTER HEALTH - [place] / Product Type: Medicaid [place] /   CPT Code: 11914  Time: 9:27-10:32  Number in Group: 8  Peer Recovery Support Specialist present during group: no    Chief Concern: "Group Therapy for Substance Use Disorder"      Subjective:   Cheryl [name] presents today for group therapy through the Comprehensive Opioid Addiction Treatment (COAT) program.  She reports 660 clinic days. The purpose of this group was Purposes to discuss healthy habits, things they have done to better their lives since recovery, and recent successes. Therapist used Motivational Interviewing to facilitate group.  . She specifically discussed her progress since dealing with psychosis and hopes to build a relationship with her children.    Objective:   Mood: within normal limits  Affect: congruent to mood   Appearance/Behavior: Alert, Good eye contact, Good participation, Receptive, Supportive of peers, Oriented , Dressed appropriately , and No acute distress   Participation:  active and attentive      Assessment:  Opioid Use Disorder, severe, on maintenance therapy   Other SUD Diagnoses: None  Procedure:   This is a Monthly  process and psychoeducational group designed to be appropriate to individuals with opioid use disorder in recovery. The focus of this group to educate about the process of building a sound recovery program, to facilitate the understanding of the disease of addiction and how  Medication Assisted Treatment (MAT) works to aid in the development of recovery, and to support the practice of new recovery tools. Attendance and experience at peer recovery meetings was reviewed. Patients were encouraged to identify triggers for relapse, employ relapse prevention strategies and to develop a supportive social network.    Plan:  Maintain abstinence, follow recommendations of Treatment Team, Continue attending the COAT Clinic group and take medication as prescribed. Attend the encouraged peer recovery meetings.    Deryl Flora, LCSW  07/09/2023, 08:36       I have read and reviewed this document. Cheryl [name], LICSW

## 2023-08-01 ENCOUNTER — Ambulatory Visit (INDEPENDENT_AMBULATORY_CARE_PROVIDER_SITE_OTHER): Payer: Self-pay

## 2023-08-01 NOTE — Telephone Encounter (Addendum)
 CM attempted to return call. Mailbox was full and a message could not be left    Cheryl [name], CT        Regarding: General Other  ----- Message from Lebron Prows sent at 08/01/2023 11:19 AM EDT -----  Copied From CRM #[id number].Cheryl [name] () called with a general question or another reason.     Pt is calling stating she thought her group was going to be a different day     Please advise

## 2023-08-02 NOTE — Progress Notes (Signed)
 [facility]  Department of Behavioral Medicine Outpatient  Comprehensive Opioid Addiction Treatment (COAT) Program  Monthly Group Progress Note    TELEMEDICINE DOCUMENTATION:    Patient Location:  Zoom Visit from [address] [address]  Patient/family aware of provider location:  Yes  Patient/family consent for telemedicine:  Yes  Examination observed and performed by: Hillman Luck, MD    Video Visit, via Zoom      Patient Name:  Cheryl [name]  MRN: [id number]  Date: 08/04/2023   Payor Source: Payor: Raina Bunting BETTER HE[employer] - [place] / Plan: Raina Bunting BETTER HE[employer] - [place] / Product Type: Medicaid [place] /   Peer Recovery Support Specialist present during group: no    Chief Concern: "Follow up Med Check for Substance Use Disorder"    Current MOUD:  Buprenorphine  Dose: 16 mg/daily       Subjective:    Patient reports 688 clinic days.  Doing "really good", travelling to [facility] this Thursday with her 60 yo son for his birthday.  He will be getting his license and she got him a car already.  Sobriety doing well.  No other concerns.    Cravings: None  Withdrawal Symptoms: None  Side Effects: none    Motivation: Good      Objective:  Mood: within normal limits    Affect: stable     Speech: normal rate/ clear/ coherent    Thought process: Goal directed/Linear     Attention: maintained and interactive     Appearance: Alert, Receptive, Oriented , and No acute distress      Insight: Good      Urine drug screen:      07/10/2021     9:00 AM 08/31/2021     2:00 PM 10/05/2021     9:00 AM 11/27/2021    12:47 PM 12/28/2021     1:00 PM 02/15/2022     9:00 AM 05/09/2022    12:00 PM   Drug Screen Results   Amphetamine (AMP) Negative Negative Negative Negative Negative Negative Negative   Barbiturates (BAR) Negative Negative Negative Negative Negative Negative Negative   BUP - Cut off Levels 10 ng/ml Positive Positive Positive Positive Positive Positive Positive   Benzodiazepine (BZO) Negative Negative Negative Negative Negative Negative Negative    Cocaine (COC) Negative Negative Negative Negative Negative Negative Negative   Methamphetamine (MET) Negative Negative Negative Negative Negative Negative Negative   Methadone (MTD) Negative Negative Negative Negative Negative Negative Negative   Oxycodone (OXY) Negative Negative Negative Negative Negative Negative Negative   Marijuana (THC) Negative Negative Negative Negative Negative Negative Negative   Ecstasy (MDMA) Negative Negative Negative Negative Negative Negative Negative   Phencyclidine (PCP) Negative Negative Negative Negative Negative Negative Negative   Tricyclic Antidepressant (TCA)   Negative Negative Negative Negative Negative   Temperature within range? yes yes yes yes yes yes yes   Observed no yes no no no yes no   Tester Michaela Adie Jk AK Ciara Ciara Ciara   Physician [address] Janina Meissner   Lot # [id number] [phone] [id number] [phone] [id number] [phone] [id number]   Expiration Date 06/20/2022 06/20/2022 04/27/2023 04/27/23 04/20/2023 04/20/2023 09/04/2023   Internal Control Valid yes yes yes yes yes yes yes   Initials CND CND Jk ak CND CND CND       Assessment:  Opioid Use Disorder, in sustained remission, on maintenance therapy     Other SUD Diagnoses: None  Procedure: Patient was seen in the COAT  Clinic in the Department of Behavioral Medicine and Psychiatry in [place], [place]. This clinic visit included medication management, addiction education, and evaluation of progress in recovery in a group setting. Patient's progress, psychosocial functioning and treatment response was discussed during the treatment team meeting with the case manager and/or therapist to assist in medical decision-making.   Plan:  Return for next scheduled appointment in COAT Clinic  Continue recovery work, including peer recovery meetings.  Continue Current MOUD: Continue previously prescribed  8/2 mg Buprenorphine -naloxone  (Suboxone ) Films , Buprenorphine  Dose:16 mg/daily .    Hillman Luck, MD

## 2023-08-04 ENCOUNTER — Telehealth: Payer: MEDICAID | Admitting: Clinical

## 2023-08-04 ENCOUNTER — Other Ambulatory Visit: Payer: Self-pay

## 2023-08-04 ENCOUNTER — Telehealth (INDEPENDENT_AMBULATORY_CARE_PROVIDER_SITE_OTHER): Payer: Self-pay | Admitting: Emergency Medicine

## 2023-08-04 DIAGNOSIS — F1120 Opioid dependence, uncomplicated: Secondary | ICD-10-CM

## 2023-08-04 MED ORDER — BUPRENORPHINE 8 MG-NALOXONE 2 MG SUBLINGUAL FILM
1.0000 | ORAL_FILM | Freq: Two times a day (BID) | SUBLINGUAL | 1 refills | Status: AC
Start: 2023-08-04 — End: ?

## 2023-08-06 NOTE — Psychotherapy Note (Signed)
 [facility]  Department of Behavioral Medicine Outpatient  Comprehensive Opioid Addiction Treatment (COAT) Program  Monthly Group Therapy Note    TELEMEDICINE DOCUMENTATION:    Patient Location: Zoom Visit from [address] [address]   Patient/family aware of provider location:  Yes  Patient/family consent for telemedicine:  Yes  Examination observed and performed by: Deryl Flora, LCSW      Video Visit, via Zoom     Patient Name:  Cheryl [name]  MRN: [id number]  Date: 08/04/2023   Payor Source: Payor: Raina Bunting BETTER HE[employer] - [place] / Plan: [employer] BETTER HEALTH - [place] / Product Type: Medicaid MC /   CPT Code: 32440  Time: 9:31-10:26  Number in Group: 8  Peer Recovery Support Specialist present during group: no    Chief Concern: "Group Therapy for Substance Use Disorder"      Subjective:   Cheryl [name] presents today for group therapy through the Comprehensive Opioid Addiction Treatment (COAT) program.  She reports 688 clinic days. The purpose of this group was to explore and process recent stressors, share recent successes, and discuss stigma. Therapist used motivational interviewing to facilitate group.  . She specifically discussed coming from rehab to this group.    Objective:   Mood: tired  Affect: congruent to mood   Appearance/Behavior: Alert, Low energy, Moderate participation, Supportive of peers, Oriented , Dressed appropriately , and No acute distress   Participation:  attentive but spoke little      Assessment:  Opioid Use Disorder, severe, on maintenance therapy   Other SUD Diagnoses: None  Procedure:   This is a Monthly  process and psychoeducational group designed to be appropriate to individuals with opioid use disorder in recovery. The focus of this group to educate about the process of building a sound recovery program, to facilitate the understanding of the disease of addiction and how Medication Assisted Treatment (MAT) works to aid in the development of recovery, and to support the practice of new  recovery tools. Attendance and experience at peer recovery meetings was reviewed. Patients were encouraged to identify triggers for relapse, employ relapse prevention strategies and to develop a supportive social network.    Plan:  Maintain abstinence, follow recommendations of Treatment Team, Continue attending the COAT Clinic group and take medication as prescribed. Attend the encouraged peer recovery meetings.    Deryl Flora, LCSW  08/06/2023, 13:42    I have read and reviewed this document. Gayl Katos, LICSW

## 2023-08-19 ENCOUNTER — Ambulatory Visit (HOSPITAL_COMMUNITY): Payer: Self-pay

## 2023-08-19 NOTE — Telephone Encounter (Addendum)
 CM forwarded message to Dr. Maryjane Snider to discuss with patient.     Jacobo Masters, CT        Regarding: Please call-script  ----- Message from Candiss Chamorro sent at 08/19/2023  1:40 PM EDT -----  Copied From CRM #[id number].Cheryl [name]  called and is wanting you to call  her about a script for nicotine  patches.    Thanks [employer]

## 2023-08-20 ENCOUNTER — Other Ambulatory Visit (HOSPITAL_COMMUNITY): Payer: Self-pay | Admitting: Emergency Medicine

## 2023-08-20 MED ORDER — NICOTINE 7 MG/24 HR DAILY TRANSDERMAL PATCH
7.0000 mg | MEDICATED_PATCH | Freq: Every day | TRANSDERMAL | 2 refills | Status: AC
Start: 2023-08-20 — End: ?

## 2023-09-18 ENCOUNTER — Encounter (HOSPITAL_COMMUNITY): Payer: Self-pay

## 2023-09-18 NOTE — Progress Notes (Signed)
 [place]  Controlled Substance Full Name Report Report Date 09/18/2023   From 06/18/2023 To 09/17/2023 Date of Birth 1985/11/23 Prescription Count 3   Last Name Leske First Name Navah Middle Name                                      Patients included in report that appear to match the search criteria.   Last Name First Name Middle Name Gender Address   [place]   F [address] , [place], [place], [address]            Prescriber Name Prescriber DEA & Zip Dispenser Name Dispenser DEA & Zip Rx Written Date Rx Dispense Date  & Date Sold    Rx Number Product Name MEDD Status Strength Qty Days # of Refill Sched Payment Type   Malary Aylesworth [address], [place], [address]   Hillman Luck [id number]  [facility] [id number] [phone] 08/04/2023 09/02/2023 09/02/2023 [id number] Suboxone  Film ACTIVE 8 mg/1; 2 mg/1 56 28 1/1 CIII Medicaid   Hillman Luck [id number]  [facility] [id number] [phone] 08/04/2023 08/04/2023 08/05/2023 [id number] Suboxone  Film INACTIVE 8 mg/1; 2 mg/1 56 28 0/1 CIII Medicaid   Hillman Luck [id number]  [facility] [id number] [phone] 07/07/2023 07/07/2023 07/07/2023 [id number] Suboxone  Film INACTIVE 8 mg/1; 2 mg/1 56 28 0/0 CIII Medicaid            Neysa Bares, CASE MANAGER

## 2023-09-29 ENCOUNTER — Telehealth (INDEPENDENT_AMBULATORY_CARE_PROVIDER_SITE_OTHER): Payer: MEDICAID | Admitting: Emergency Medicine

## 2023-09-29 ENCOUNTER — Other Ambulatory Visit: Payer: Self-pay

## 2023-09-29 ENCOUNTER — Telehealth (INDEPENDENT_AMBULATORY_CARE_PROVIDER_SITE_OTHER): Payer: MEDICAID | Admitting: Clinical

## 2023-09-29 DIAGNOSIS — F1120 Opioid dependence, uncomplicated: Secondary | ICD-10-CM

## 2023-09-29 MED ORDER — BUPRENORPHINE 8 MG-NALOXONE 2 MG SUBLINGUAL FILM
1.0000 | ORAL_FILM | Freq: Two times a day (BID) | SUBLINGUAL | 0 refills | Status: DC
Start: 2023-09-29 — End: 2023-10-27

## 2023-09-29 MED ORDER — NICOTINE 14 MG/24 HR DAILY TRANSDERMAL PATCH
14.0000 mg | MEDICATED_PATCH | Freq: Every day | TRANSDERMAL | 2 refills | Status: AC
Start: 2023-09-29 — End: ?

## 2023-09-29 NOTE — Progress Notes (Signed)
 [facility]  Department of Behavioral Medicine Outpatient  Comprehensive Opioid Addiction Treatment (COAT) Program  Monthly Group Progress Note    TELEMEDICINE DOCUMENTATION:    Patient Location:  Zoom Visit from [address] [address]  Patient/family aware of provider location:  Yes  Patient/family consent for telemedicine:  Yes  Examination observed and performed by: Ozell Hock, MD    Video Visit, via Zoom      Patient Name:  Cheryl [name]  MRN: [id number]  Date: 09/29/2023   Payor Source: Payor: [employer] BETTER [employer] - [place] / Plan: [employer] BETTER HE[employer] -  / Product Type: Medicaid MC /   Peer Recovery Support Specialist present during group: no    Chief Concern: Follow up Med Check for Substance Use Disorder    Current MOUD:  Buprenorphine  Dose: 16 mg/daily       Subjective:    Patient reports 744 clinic days.  Doing well, went to [facility] with [place] for his 64 th [facility], saw a Advertising account planner.  Sobriety doing well.  No other concerns.    Cravings: None  Withdrawal Symptoms: None  Side Effects: none    Motivation: Good      Objective:  Mood: within normal limits    Affect: stable     Speech: normal rate/ clear/ coherent    Thought process: Goal directed/Linear     Attention: maintained and interactive     Appearance: Alert, Receptive, Oriented , and No acute distress      Insight: Good      Urine drug screen:      07/10/2021     9:00 AM 08/31/2021     2:00 PM 10/05/2021     9:00 AM 11/27/2021    12:47 PM 12/28/2021     1:00 PM 02/15/2022     9:00 AM 05/09/2022    12:00 PM   Drug Screen Results   Amphetamine (AMP) Negative Negative Negative Negative Negative Negative Negative   Barbiturates (BAR) Negative Negative Negative Negative Negative Negative Negative   BUP - Cut off Levels 10 ng/ml Positive Positive Positive Positive Positive Positive Positive   Benzodiazepine (BZO) Negative Negative Negative Negative Negative Negative Negative   Cocaine (COC) Negative Negative Negative Negative Negative Negative Negative    Methamphetamine (MET) Negative Negative Negative Negative Negative Negative Negative   Methadone (MTD) Negative Negative Negative Negative Negative Negative Negative   Oxycodone (OXY) Negative Negative Negative Negative Negative Negative Negative   Marijuana (THC) Negative Negative Negative Negative Negative Negative Negative   Ecstasy (MDMA) Negative Negative Negative Negative Negative Negative Negative   Phencyclidine (PCP) Negative Negative Negative Negative Negative Negative Negative   Tricyclic Antidepressant (TCA)   Negative Negative Negative Negative Negative   Temperature within range? yes yes yes yes yes yes yes   Observed no yes no no no yes no   Tester Golda Golda Jk AK Ciara Ciara Ciara   Physician [address]   Lot # [id number] [phone] [id number] [phone] [id number]   Expiration Date 06/20/2022 06/20/2022 04/27/2023 04/27/23 04/20/2023 04/20/2023 09/04/2023   Internal Control Valid yes yes yes yes yes yes yes   Initials CND CND Jk ak CND CND CND       Assessment:  Opioid Use Disorder, in sustained remission, on maintenance therapy     Other SUD Diagnoses: None  Procedure: Patient was seen in the COAT Clinic in the Department of Behavioral Medicine and Psychiatry in [place], [place]. This clinic  visit included medication management, addiction education, and evaluation of progress in recovery in a group setting. Patient's progress, psychosocial functioning and treatment response was discussed during the treatment team meeting with the case manager and/or therapist to assist in medical decision-making.   Plan:  Return for next scheduled appointment in COAT Clinic  Continue recovery work, including peer recovery meetings.  Continue Current MOUD: Continue previously prescribed  8/2 mg Buprenorphine -naloxone  (Suboxone ) Films , Buprenorphine  Dose:16 mg/daily .    Ozell Hock, MD

## 2023-09-30 NOTE — Psychotherapy Note (Signed)
 [facility]  Department of Behavioral Medicine Outpatient  Comprehensive Opioid Addiction Treatment (COAT) Program  Monthly Group Therapy Note    TELEMEDICINE DOCUMENTATION:    Patient Location: Zoom Visit from [address] [address]   Patient/family aware of provider location:  Yes  Patient/family consent for telemedicine:  Yes  Examination observed and performed by: Alfonso Gal, LCSW      Video Visit, via Zoom     Patient Name:  Cheryl [name]  MRN: [id number]  Date: 09/29/2023   Payor Source: Payor: [employer]TER [employer] - [place] / Plan: AETNA BETTER HEALTH - [place] / Product Type: Medicaid MC /   CPT Code: 09146  Time:9:29-10:25  Number in Group: 7  Peer Recovery Support Specialist present during group: no    Chief Concern: Group Therapy for Substance Use Disorder      Subjective:   Cheryl [name] presents today for group therapy through the Comprehensive Opioid Addiction Treatment (COAT) program.  She reports 744 clinic days. The purpose of this group was was to explore recent stressors, nicotine /tobacco use, and concerns of the group. Motivational Interviewing was used to facilitate group. . She specifically discussed how her experience with going to jail helped her recovery journey, and the process she is taking to address her DUI.    Objective:   Mood: within normal limits  Affect: congruent to mood   Appearance/Behavior: Alert, Good eye contact, Good participation, Receptive, Supportive of peers, Oriented , Dressed appropriately , and No acute distress   Participation:  active and attentive      Assessment:  Opioid Use Disorder, severe, on maintenance therapy   Other SUD Diagnoses: None  Procedure:   This is a Monthly  process and psychoeducational group designed to be appropriate to individuals with opioid use disorder in recovery. The focus of this group is to educate about the process of building a sound recovery program, to facilitate the understanding of the disease of addiction and how Medication Assisted  Treatment (MAT) works to aid in the development of recovery, and to support the practice of new recovery tools. Attendance and experience at peer recovery meetings was reviewed. Patients were encouraged to identify triggers for relapse, employ relapse prevention strategies and to develop a supportive social network.    Plan:  Maintain abstinence, follow recommendations of Treatment Team, Continue attending the COAT Clinic group and take medication as prescribed. Attend the encouraged peer recovery meetings.    Alfonso Gal, LCSW  09/30/2023, 13:30      I have read and reviewed this document. Alan Snipe, LICSW

## 2023-10-06 ENCOUNTER — Encounter (INDEPENDENT_AMBULATORY_CARE_PROVIDER_SITE_OTHER): Payer: Self-pay | Admitting: Emergency Medicine

## 2023-10-27 ENCOUNTER — Encounter (INDEPENDENT_AMBULATORY_CARE_PROVIDER_SITE_OTHER): Payer: Self-pay

## 2023-10-27 ENCOUNTER — Other Ambulatory Visit: Payer: Self-pay

## 2023-10-27 ENCOUNTER — Telehealth: Payer: MEDICAID | Admitting: Clinical

## 2023-10-27 ENCOUNTER — Telehealth: Payer: MEDICAID | Admitting: Emergency Medicine

## 2023-10-27 DIAGNOSIS — F1120 Opioid dependence, uncomplicated: Secondary | ICD-10-CM

## 2023-10-27 DIAGNOSIS — Z79899 Other long term (current) drug therapy: Secondary | ICD-10-CM

## 2023-10-27 DIAGNOSIS — F1121 Opioid dependence, in remission: Secondary | ICD-10-CM

## 2023-10-27 MED ORDER — BUPRENORPHINE 8 MG-NALOXONE 2 MG SUBLINGUAL FILM
1.0000 | ORAL_FILM | Freq: Two times a day (BID) | SUBLINGUAL | 0 refills | Status: DC
Start: 2023-10-27 — End: 2023-11-24

## 2023-10-27 NOTE — Nursing Note (Signed)
 Person notified to present to [facility], [place], [place], or approved outside location by EOD on 10/29/23 for RANDOM (not selected for observation) urine drug screen.  Pt to be sent out for ETGR, GABA, BUP, and FENT    Pt reported 772 with No Relapse      Patient was educated to contact case manager to inquire about [facility] Medicine affiliate locations.    Taniah Reinecke, MA  10/27/2023, 09:56

## 2023-10-27 NOTE — Psychotherapy Note (Signed)
 [facility]  Department of Behavioral Medicine Outpatient  Comprehensive Opioid Addiction Treatment (COAT) Program  Monthly Group Therapy Note    TELEMEDICINE DOCUMENTATION:    Patient Location: Zoom Visit from [address] [address]   Patient/family aware of provider location:  Yes  Patient/family consent for telemedicine:  Yes  Examination observed and performed by: Alfonso Gal, LCSW      Video Visit, via Zoom     Patient Name:  Cheryl [name]  MRN: [id number]  Date: 10/27/2023   Payor Source: Payor: [employer] BETTER HE[employer] - [place] / Plan: AETNA BETTER HEALTH - [place] / Product Type: Medicaid MC /   CPT Code: 09146  Time:9:34-10:30  Number in Group: 6  Peer Recovery Support Specialist present during group: no    Chief Concern: Group Therapy for Substance Use Disorder      Subjective:   Cheryl [name] presents today for group therapy through the Comprehensive Opioid Addiction Treatment (COAT) program.  She reports 772 clinic days. The purpose of this group was to explore recent stressors, concerns, and offer others in the group support. Therapist used motivational interviewing to facilitate group.   . She specifically discussed she discussed a recent stressful event at work, and her recent treat to herself.    Objective:   Mood: within normal limits  Affect: congruent to mood   Appearance/Behavior: Alert, Good eye contact, Good participation, Receptive, Supportive of peers, Oriented , Dressed appropriately , and No acute distress   Participation:  active and attentive      Assessment:  Opioid Use Disorder, severe, on maintenance therapy   Other SUD Diagnoses: None  Procedure:   This is a Monthly  process and psychoeducational group designed to be appropriate to individuals with opioid use disorder in recovery. The focus of this group is to educate about the process of building a sound recovery program, to facilitate the understanding of the disease of addiction and how Medication Assisted Treatment (MAT) works to aid in  the development of recovery, and to support the practice of new recovery tools. Attendance and experience at peer recovery meetings was reviewed. Patients were encouraged to identify triggers for relapse, employ relapse prevention strategies and to develop a supportive social network.    Plan:  Maintain abstinence, follow recommendations of Treatment Team, Continue attending the COAT Clinic group and take medication as prescribed. Attend the encouraged peer recovery meetings.    Alfonso Gal, LCSW  10/27/2023, 14:14      I have read and reviewed this document. Alan Snipe, LICSW

## 2023-10-27 NOTE — Progress Notes (Signed)
 [facility]  Department of Behavioral Medicine Outpatient  Comprehensive Opioid Addiction Treatment (COAT) Program  Monthly Group Progress Note    TELEMEDICINE DOCUMENTATION:    Patient Location:  Zoom Visit from [address] [address]  Patient/family aware of provider location:  Yes  Patient/family consent for telemedicine:  Yes  Examination observed and performed by: Ozell Hock, MD    Video Visit, via Zoom      Patient Name:  Cheryl [name]  MRN: [id number]  Date: 10/27/2023   Payor Source: Payor: [employer] BETTER HE[employer] - [place] / Plan: [employer] BETTER HEALTH - [place] / Product Type: Medicaid [place] /   Peer Recovery Support Specialist present during group: no    Chief Concern: Follow up Med Check for Substance Use Disorder    Current MOUD:  Buprenorphine  Dose: 16 mg/daily       Subjective:    Patient reports 772 clinic days.  Got a golden doodle puppy, he's black which is unusual with this breed.  Sobriety doing well.  No other concerns.    Cravings: None  Withdrawal Symptoms: None  Side Effects: none    Motivation: Good      Objective:  Mood: within normal limits    Affect: stable     Speech: normal rate/ clear/ coherent    Thought process: Goal directed/Linear     Attention: maintained and interactive     Appearance: Alert, Receptive, Oriented , and No acute distress      Insight: Good      Urine drug screen:      07/10/2021     9:00 AM 08/31/2021     2:00 PM 10/05/2021     9:00 AM 11/27/2021    12:47 PM 12/28/2021     1:00 PM 02/15/2022     9:00 AM 05/09/2022    12:00 PM   Drug Screen Results   Amphetamine (AMP) Negative Negative Negative Negative Negative Negative Negative   Barbiturates (BAR) Negative Negative Negative Negative Negative Negative Negative   BUP - Cut off Levels 10 ng/ml Positive Positive Positive Positive Positive Positive Positive   Benzodiazepine (BZO) Negative Negative Negative Negative Negative Negative Negative   Cocaine (COC) Negative Negative Negative Negative Negative Negative Negative    Methamphetamine (MET) Negative Negative Negative Negative Negative Negative Negative   Methadone (MTD) Negative Negative Negative Negative Negative Negative Negative   Oxycodone (OXY) Negative Negative Negative Negative Negative Negative Negative   Marijuana (THC) Negative Negative Negative Negative Negative Negative Negative   Ecstasy (MDMA) Negative Negative Negative Negative Negative Negative Negative   Phencyclidine (PCP) Negative Negative Negative Negative Negative Negative Negative   Tricyclic Antidepressant (TCA)   Negative Negative Negative Negative Negative   Temperature within range? yes yes yes yes yes yes yes   Observed no yes no no no yes no   Tester Golda Golda Jk AK Ciara Ciara Ciara   Physician [address]   Lot # [id number] [phone] [id number] [phone] [id number] [phone] [id number]   Expiration Date 06/20/2022 06/20/2022 04/27/2023 04/27/23 04/20/2023 04/20/2023 09/04/2023   Internal Control Valid yes yes yes yes yes yes yes   Initials CND CND Jk ak CND CND CND       Assessment:  Opioid Use Disorder, in sustained remission, on maintenance therapy     Other SUD Diagnoses: None  Procedure: Patient was seen in the COAT Clinic in the Department of Behavioral Medicine and Psychiatry in [place], [place]. This clinic visit included medication  management, addiction education, and evaluation of progress in recovery in a group setting. Patient's progress, psychosocial functioning and treatment response was discussed during the treatment team meeting with the case manager and/or therapist to assist in medical decision-making.   Plan:  Return for next scheduled appointment in COAT Clinic  Continue recovery work, including peer recovery meetings.  Continue Current MOUD: Continue previously prescribed  8/2 mg Buprenorphine -naloxone  (Suboxone ) Films , Buprenorphine  Dose:16 mg/daily .    Ozell Hock, MD

## 2023-10-29 ENCOUNTER — Ambulatory Visit: Payer: MEDICAID | Attending: Emergency Medicine

## 2023-10-29 ENCOUNTER — Other Ambulatory Visit: Payer: Self-pay

## 2023-10-29 ENCOUNTER — Other Ambulatory Visit (HOSPITAL_COMMUNITY): Payer: MEDICAID | Admitting: Emergency Medicine

## 2023-10-29 DIAGNOSIS — F1120 Opioid dependence, uncomplicated: Secondary | ICD-10-CM | POA: Insufficient documentation

## 2023-10-29 LAB — DRUG SCREEN, WITH CONFIRMATION, URINE
AMPHETAMINES URINE: NEGATIVE
BARBITURATES URINE: NEGATIVE
BENZODIAZEPINES URINE: NEGATIVE
BUPRENORPHINE URINE: POSITIVE — AB
CANNABINOIDS URINE: NEGATIVE
COCAINE METABOLITES URINE: NEGATIVE
FENTANYL, URINE: NEGATIVE
METHADONE URINE: NEGATIVE
OPIATES URINE: NEGATIVE
OXYCODONE URINE: NEGATIVE
PCP URINE: NEGATIVE

## 2023-10-31 LAB — CREATININE URINE, RANDOM: CREATININE RANDOM URINE: 203 mg/dL — ABNORMAL HIGH (ref 50.00–100.00)

## 2023-11-02 LAB — DRUG MONITORING, GABAPENTIN, QUANTITATIVE, URINE: GABAPENTIN: NEGATIVE ng/mL (ref <1000)

## 2023-11-02 LAB — NOTES AND COMMENTS

## 2023-11-03 LAB — SUBOXONE CONFIRMATORY/DEFINITIVE, URINE, BY LC-MS/MS (PERFORMABLE)
BUPRENORPHINE: 290 ng/mL — ABNORMAL HIGH (ref <10)
NALOXONE: >1000 ng/mL — ABNORMAL HIGH (ref <5)
NORBUPRENORPHINE: >1000 ng/mL — ABNORMAL HIGH (ref <10)

## 2023-11-03 LAB — FENTANYL CONFIRMATORY/DEFINITIVE, URINE, BY LC-MS/MS (PERFORMABLE)
FENTANYL INTERPRETATION: NEGATIVE
FENTANYL: NOT DETECTED ng/mL (ref <0.5)
NORFENTANYL: NOT DETECTED ng/mL (ref <2)

## 2023-11-04 LAB — ETHYL GLUCURONIDE & ETHYL SULFATE (ALCOHOL METABOLITES),URINE
CREATININE RANDOM URINE: 203 mg/dL (ref >=20.00)
ETHYL GLUCURONIDE: NEGATIVE
ETHYL SULFATE: NEGATIVE

## 2023-11-04 LAB — ETHYL GLUCURONIDE AND ETHYL SULFATE, URINE
ETHYL GLUCURONIDE: NEGATIVE
ETHYL SULFATE: NEGATIVE

## 2023-11-05 ENCOUNTER — Encounter (HOSPITAL_COMMUNITY): Payer: Self-pay

## 2023-11-05 NOTE — Progress Notes (Signed)
 [place]  Controlled Substance Full Name Report Report Date 11/05/2023   From 08/06/2023 To 11/04/2023 Date of Birth [birth year] Prescription Count 3   Last Name [place]ley First Name Shiara Middle Name                                          Patients included in report that appear to match the search criteria.   Last Name First Name Middle Name Gender Address   [place]   F [address] , [place], [place], [address]                Prescriber Name Prescriber DEA & Zip Dispenser Name Dispenser DEA & Zip Rx Written Date Rx Dispense Date  & Date Sold    Rx Number Product Name MEDD Status Strength Qty Days # of Refill Sched Payment Type   Malyah Ohlrich [address], [place], [address]   Germaine Sharper [id number]  [facility] [id number] [phone] 10/27/2023 10/27/2023 10/27/2023 [id number] Suboxone  Film ACTIVE 8 mg/1; 2 mg/1 56 28 0/0 CIII Medicaid   Germaine Sharper [id number]  [facility] [id number] [phone] 09/29/2023 09/29/2023 09/29/2023 [id number] Suboxone  Film INACTIVE 8 mg/1; 2 mg/1 56 28 0/0 CIII Medicaid   Germaine Sharper [id number]  [facility] [id number] [phone] 08/04/2023 09/02/2023 09/02/2023 [id number] Suboxone  Film INACTIVE 8 mg/1; 2 mg/1 56 28 1/1 CIII Medicaid

## 2023-11-24 ENCOUNTER — Other Ambulatory Visit: Payer: Self-pay

## 2023-11-24 ENCOUNTER — Ambulatory Visit (INDEPENDENT_AMBULATORY_CARE_PROVIDER_SITE_OTHER): Payer: MEDICAID | Admitting: Clinical

## 2023-11-24 ENCOUNTER — Telehealth (INDEPENDENT_AMBULATORY_CARE_PROVIDER_SITE_OTHER): Payer: MEDICAID | Admitting: Emergency Medicine

## 2023-11-24 DIAGNOSIS — F1120 Opioid dependence, uncomplicated: Secondary | ICD-10-CM

## 2023-11-24 DIAGNOSIS — Z79899 Other long term (current) drug therapy: Secondary | ICD-10-CM

## 2023-11-24 DIAGNOSIS — F1121 Opioid dependence, in remission: Secondary | ICD-10-CM

## 2023-11-24 MED ORDER — BUPRENORPHINE 8 MG-NALOXONE 2 MG SUBLINGUAL FILM
1.0000 | ORAL_FILM | Freq: Two times a day (BID) | SUBLINGUAL | 0 refills | Status: DC
Start: 2023-11-24 — End: 2023-12-21
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
## 2023-11-28 ENCOUNTER — Telehealth (INDEPENDENT_AMBULATORY_CARE_PROVIDER_SITE_OTHER): Payer: MEDICAID

## 2023-11-28 ENCOUNTER — Other Ambulatory Visit: Payer: Self-pay

## 2023-11-28 DIAGNOSIS — R110 Nausea: Secondary | ICD-10-CM

## 2023-11-28 MED ORDER — ONDANSETRON 4 MG DISINTEGRATING TABLET
ORAL_TABLET | ORAL | 0 refills | Status: AC
Start: 2023-11-28 — End: ?

## 2023-11-28 NOTE — Progress Notes (Signed)
 [facility]  [address]  [place] [address]    Video Visit     Name:  Cheryl [name] MRN: [id number]   Date:    11/28/2023 Age:  38 y.o.                                      Patient's location: Home - [place] [facility] [address]   Patient/family aware of provider location: Yes  Patient/family consent for video visit: Yes  Interview and observation performed by: Jaasiel Hollyfield PA-C    @Attending : Leopoldo    Chief Complaint: No chief complaint on file.    History of Present Illness:  Cheryl [name] is a 38 y.o. female c/o abd pain intermittent x a few. Seen by PCP, and is scheduled for a HIDA scan. Not vomiting, but nauseated. + Constipation. No chance of pregnancy        Past Medical History:  She has no past medical history on file.  She has a past surgical history that includes hx cesarean section and Ovary surgery.  She does not have a problem list on file.    Allergies:  Allergies[1]    Medications:    Benzonatate     buprenorphine -naloxone     busPIRone    citalopram    cloNIDine  HCL    lidocaine     Methylprednisolone     nicotine     ondansetron     predniSONE     QUEtiapine       Observational Exam:    General:  Well appearing and No acute distress  Head:  Normocephalic and Atraumatic  Eyes:  Normal lids/lashes, EOMI, and normal conjunctiva  ENT:  MMM  Pulmonary:  No signs of respiratory distress, accessory muscle use. Or labored breathing  Cardiovascular:  No pallor or cyanosis noted  Skin:  warm/dry and no rash  Psychiatric:  Appropriate affect and behavior  Neurologic:   Alert and oriented x 3,  and no focal deficits    Differential Diagnosis:  Irritable bowel syndrome, constipation    Assessment:   Assessment/Plan   1. Nausea        Plan:    Orders Placed This Encounter    ondansetron  (ZOFRAN  ODT) 4 mg Oral Tablet, Rapid Dissolve      Patient in with intermittent abdominal pain for the past few months.  She has been worked up by her primary care doctor.  She is just requesting some  Zofran  and a work excuse for today.  She is not having a fever or significant increase of symptoms.  I sent a work excuse and prescription for Zofran  over.  I discussed that we can not evaluate abdominal pain through an video visit.  If she is having any significant pain she should be seen in person by your PCP, urgent care or the emergency department to rule out serious or life-threatening medical conditions.  Patient understands states that she will go if symptoms get worse.    Plan was discussed and patient/parent/guardian verbalized understanding.  If symptoms are not improving the patient should be seen in person by their PCP or at Urgent Care for further evaluation. If symptoms are worsening or emergent present to Emergency Department for higher level of evaluation and care.      Juliene KATHEE Birk, PA-C 11/28/2023, 11:35         [1] No Known Allergies

## 2023-12-10 ENCOUNTER — Telehealth: Payer: MEDICAID | Admitting: Physician Assistant

## 2023-12-10 ENCOUNTER — Other Ambulatory Visit: Payer: Self-pay

## 2023-12-10 DIAGNOSIS — Z029 Encounter for administrative examinations, unspecified: Secondary | ICD-10-CM

## 2023-12-10 NOTE — Progress Notes (Signed)
 Patient had intended for the visit to be for her son.  Explained that that would have to be through her son's MyChart so video visit will be canceled  The patient did not appear for their appointment/or scheduled appointment was cancelled.  This office visit opened in error.
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
## 2023-12-22 ENCOUNTER — Telehealth (INDEPENDENT_AMBULATORY_CARE_PROVIDER_SITE_OTHER): Payer: Self-pay | Admitting: Emergency Medicine

## 2023-12-22 ENCOUNTER — Telehealth (INDEPENDENT_AMBULATORY_CARE_PROVIDER_SITE_OTHER): Payer: MEDICAID | Admitting: Mental Health

## 2023-12-22 ENCOUNTER — Other Ambulatory Visit: Payer: Self-pay

## 2023-12-22 DIAGNOSIS — F1120 Opioid dependence, uncomplicated: Secondary | ICD-10-CM

## 2023-12-22 DIAGNOSIS — Z79899 Other long term (current) drug therapy: Secondary | ICD-10-CM

## 2023-12-22 DIAGNOSIS — F1121 Opioid dependence, in remission: Secondary | ICD-10-CM

## 2023-12-22 MED ORDER — BUPRENORPHINE 8 MG-NALOXONE 2 MG SUBLINGUAL FILM
1.0000 | ORAL_FILM | Freq: Two times a day (BID) | SUBLINGUAL | 0 refills | Status: DC
Start: 2023-12-22 — End: 2024-01-19

## 2023-12-22 MED ORDER — NALOXONE 4 MG/ACTUATION NASAL SPRAY
1.0000 | NASAL | 0 refills | Status: AC | PRN
Start: 2023-12-22 — End: ?

## 2023-12-22 NOTE — Progress Notes (Addendum)
 TELEMEDICINE DOCUMENTATION:    Patient Location:  MyChart video visit from home address: [address] [address]    Patient/family aware of provider location:  yes  Patient/family consent for telemedicine:  yes  Examination observed and performed by:  Stephane Porta, Licensed Professional Counselor        This service occurred via video.     GROUP THERAPY    NAME: Arland CY Ada  Date of Service: 12/22/2023  TIME: 9:30 am to 10:30 am  DURATION: 60 minutes   CPT CODE: 09146  CHIEF COMPLAINT: Group Therapy   NUMBER IN GROUP: 7    Subjective: Patient seen for group therapy.  Patient has 800 days without the use of substances and has attended required meetings.     Group focused on Chapter 46: Resentment from the book Don't Let the [employer] Down. We explored how resentment continues to cause emotional distress to the person harboring the emotion and impacts the ability to grow in recovery. Lorissa shared she does not hold resentment but has used accepting responsibility for actions as a way to avoid the trap. There is sadness she does not have contact with her 16 year old son but she is trying to stay hopeful that this might change at some time. 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     Observation:  Mood: Pleasant  Affect: Full  Thought processes: Logical  Participation: Patient participated willingly.    Assessment: Opioid Use Disorder, with dependence, on maintenance therapy    Procedure: Patient attended the Advanced Monthly COAT Group. The focus of this group to educate about the process of  building a sound recovery program, to facilitate the understanding of the disease of addiction and how Suboxone  works to aid in the development of recovery, and to support the practice of new recovery tools. Encouragement of attendance at 12 step recovery meetings, identification of triggers for use/ relapse and the development of a supportive social network were also  covered.    Plan: Abstinence, take suboxone  as prescribed, and return to clinic in one month.      Stephane JAYSON Porta, [facility]  [place] Department of Behavioral Medicine and Psychiatry

## 2024-01-02 ENCOUNTER — Encounter (HOSPITAL_COMMUNITY): Payer: Self-pay

## 2024-01-02 NOTE — Progress Notes (Signed)
 [place]  Controlled Substance Full Name Report Report Date 01/02/2024   From 10/02/2023 To 01/01/2024 Date of Birth 1985/05/07 Prescription Count 4   Last Name [name] First Name Cheryl Middle Name                   Patients included in report that appear to match the search criteria.   Last Name First Name Middle Name Gender Address   [place]  [address] , [place], [place], [address]         Prescriber Name Prescriber DEA & Zip Dispenser Name Dispenser DEA & Zip Rx Written Date Rx Dispense Date  & Date Sold   Rx Number Product Name MEDD Status Strength Qty Days # of Refill Sched Payment Type   Fabiha Rougeau [address], [place], [address]   Germaine Sharper [id number]  [facility] [id number] [phone] 12/22/2023 12/22/2023 12/22/2023 [id number] Narcan  INACTIVE 4 mg/.1mL 2 1 0/0  Medicaid   Germaine Sharper [id number]  [facility] [id number] [phone] 12/22/2023 12/22/2023 12/22/2023 [id number] Suboxone  Film ACTIVE 8 mg/1; 2 mg/1 56 28 0/0 CIII Medicaid   Germaine Sharper [id number]  [facility] [id number] [phone] 11/24/2023 11/24/2023 11/24/2023 [id number] Suboxone  Film INACTIVE 8 mg/1; 2 mg/1 56 28 0/0 CIII Medicaid   Germaine Sharper [id number]  [facility] [id number] [phone] 10/27/2023 10/27/2023 10/27/2023 [id number] Suboxone  Film INACTIVE 8 mg/1; 2 mg/1 56 28 0/0 CIII Medicaid

## 2024-01-08 ENCOUNTER — Telehealth: Payer: MEDICAID

## 2024-01-08 ENCOUNTER — Encounter (INDEPENDENT_AMBULATORY_CARE_PROVIDER_SITE_OTHER): Payer: Self-pay

## 2024-01-08 DIAGNOSIS — U071 COVID-19: Secondary | ICD-10-CM

## 2024-01-08 NOTE — Progress Notes (Signed)
 [facility]  [address]  [place] [address]    Video Visit     Name:  Cheryl [name] MRN: [id number]   Date:    01/08/2024 Age:  38 y.o.                                      Patient's location: Home - [place] [facility] [address]   Patient/family aware of provider location: Yes  Patient/family consent for video visit: Yes  Interview and observation performed by: Camellia Marina NP    @Attending @    Chief Complaint: Work Excuse    History of Present Illness:  Cheryl [name] is a 38 y.o. female CC tested positive for COVID. Experiencing fevers, sore throat, coughing, fatigue. Onset yesterday. Exposed to mother who also had COVID. Taking OTC medications. Requesting note for work. Requesting guidance for isolation period.      Past Medical History:  She has no past medical history on file.  She has a past surgical history that includes hx cesarean section and Ovary surgery.  She does not have a problem list on file.    Allergies:  Allergies[1]    Medications:    Benzonatate     buprenorphine -naloxone     busPIRone    citalopram    cloNIDine  HCL    lidocaine     Methylprednisolone     naloxone     nicotine     ondansetron     predniSONE     QUEtiapine       Observational Exam:    General:  Well appearing and No acute distress  Head:  Normocephalic and Atraumatic  Eyes:  Normal lids/lashes, EOMI, and normal conjunctiva  ENT:  MMM  Pulmonary:  No signs of respiratory distress, accessory muscle use. Or labored breathing  Cardiovascular:  No pallor or cyanosis noted  Skin:  warm/dry and no rash  Psychiatric:  Appropriate affect and behavior  Neurologic:   Alert and oriented x 3,  and no focal deficits    Differential Diagnosis: work excuse, advice only, COVID    Assessment:   Assessment/Plan   1. COVID        Plan:  No orders of the defined types were placed in this encounter.     Excuse provided  Provided guidance for isolation - symptom improvement and afebrile x24 hrs off  antipyretics    Plan was discussed and patient/parent/guardian verbalized understanding.  If symptoms are not improving the patient should be seen in person by their PCP or at Urgent Care for further evaluation. If symptoms are worsening or emergent present to Emergency Department for higher level of evaluation and care.      Camellia Marina, APRN, CNP 01/08/2024, 08:44         [1] No Known Allergies

## 2024-01-19 ENCOUNTER — Telehealth (INDEPENDENT_AMBULATORY_CARE_PROVIDER_SITE_OTHER): Payer: MEDICAID | Admitting: Emergency Medicine

## 2024-01-19 ENCOUNTER — Other Ambulatory Visit: Payer: Self-pay

## 2024-01-19 ENCOUNTER — Telehealth (INDEPENDENT_AMBULATORY_CARE_PROVIDER_SITE_OTHER): Payer: MEDICAID | Admitting: Clinical

## 2024-01-19 DIAGNOSIS — F17200 Nicotine dependence, unspecified, uncomplicated: Secondary | ICD-10-CM

## 2024-01-19 DIAGNOSIS — F1120 Opioid dependence, uncomplicated: Secondary | ICD-10-CM

## 2024-01-19 MED ORDER — BUPRENORPHINE 8 MG-NALOXONE 2 MG SUBLINGUAL FILM
1.0000 | ORAL_FILM | Freq: Two times a day (BID) | SUBLINGUAL | 0 refills | Status: DC
Start: 2024-01-19 — End: 2024-02-16

## 2024-01-19 NOTE — Progress Notes (Signed)
 [facility]  Department of Behavioral Medicine Outpatient  Comprehensive Opioid Addiction Treatment (COAT) Program  Monthly Group Progress Note    TELEMEDICINE DOCUMENTATION:    Patient Location:  Zoom Visit from [address] [address]  Patient/family aware of provider location:  Yes  Patient/family consent for telemedicine:  Yes  Examination observed and performed by: Ozell Hock, MD    Video Visit, via Zoom      Patient Name:  Cheryl [name]  MRN: [id number]  Date: 01/19/2024   Payor Source: Payor: [employer] - [place] / Plan: [employer] -  / Product Type: Medicaid [place] /   Peer Recovery Support Specialist present during group: no    Chief Concern: Follow up Med Check for Substance Use Disorder    Current MOUD:  Buprenorphine  Dose: 16 mg/daily       Subjective:    Patient reports 856 clinic days.  Things are going well, mom is better from MRSA but was back in the hospital for hypoxia, has COPD.  Ragina quit smoking but still vapes, is wanting to quit this.  Plans to get dental implants soon, will cost 11k but she should have the money.  Sobriety doing well.  No other concerns.    Cravings: None  Withdrawal Symptoms: None  Side Effects: none    Motivation: Good      Objective:  Mood: within normal limits    Affect: stable     Speech: normal rate/ clear/ coherent    Thought process: Goal directed/Linear     Attention: maintained and interactive     Appearance: Alert, Receptive, Oriented , and No acute distress      Insight: Good      Urine drug screen:      07/10/2021     9:00 AM 08/31/2021     2:00 PM 10/05/2021     9:00 AM 11/27/2021    12:47 PM 12/28/2021     1:00 PM 02/15/2022     9:00 AM 05/09/2022    12:00 PM   Drug Screen Results   Amphetamine (AMP) Negative Negative Negative Negative Negative Negative Negative   Barbiturates (BAR) Negative Negative Negative Negative Negative Negative Negative   BUP - Cut off Levels 10 ng/ml Positive Positive Positive Positive Positive Positive Positive    Benzodiazepine (BZO) Negative Negative Negative Negative Negative Negative Negative   Cocaine (COC) Negative Negative Negative Negative Negative Negative Negative   Methamphetamine (MET) Negative Negative Negative Negative Negative Negative Negative   Methadone (MTD) Negative Negative Negative Negative Negative Negative Negative   Oxycodone (OXY) Negative Negative Negative Negative Negative Negative Negative   Marijuana (THC) Negative Negative Negative Negative Negative Negative Negative   Ecstasy (MDMA) Negative Negative Negative Negative Negative Negative Negative   Phencyclidine (PCP) Negative Negative Negative Negative Negative Negative Negative   Tricyclic Antidepressant (TCA)   Negative Negative Negative Negative Negative   Temperature within range? yes yes yes yes yes yes yes   Observed no yes no no no yes no   Tester Golda Golda Jk AK Ciara Ciara Ciara   Physician [address]   Lot [place] [id number] [phone] [id number] [phone] [id number]   Expiration Date 06/20/2022 06/20/2022 04/27/2023 04/27/23 04/20/2023 04/20/2023 09/04/2023   Internal Control Valid yes yes yes yes yes yes yes   Initials CND CND Jk ak CND CND CND       Assessment:  Opioid Use Disorder, in sustained remission, on maintenance  therapy     Other SUD Diagnoses: None  Procedure: Patient was seen in the COAT Clinic in the Department of Behavioral Medicine and Psychiatry in [place], [place]. This clinic visit included medication management, addiction education, and evaluation of progress in recovery in a group setting. Patient's progress, psychosocial functioning and treatment response was discussed during the treatment team meeting with the case manager and/or therapist to assist in medical decision-making.   Plan:  Return for next scheduled appointment in COAT Clinic  Continue recovery work, including peer recovery meetings.  Continue Current MOUD: Continue previously prescribed  8/2 mg  Buprenorphine -naloxone  (Suboxone ) Films , Buprenorphine  Dose:16 mg/daily .    Ozell Hock, MD

## 2024-01-20 NOTE — Psychotherapy Note (Signed)
 [facility]  Department of Behavioral Medicine Outpatient  Comprehensive Opioid Addiction Treatment (COAT) Program  Monthly Group Therapy Note    TELEMEDICINE DOCUMENTATION:    Patient Location: [address] [address]   Patient/family aware of provider location:  Yes  Patient/family consent for telemedicine:  Yes  Examination observed and performed by: Alfonso Gal, LGSW      Video Visit, via ZOOM    Patient Name:  Cheryl [name]  MRN: [id number]  Date: 01/19/2024   Payor Source: Payor: [employer] - [place] / Plan: [employer] - [place] / Product Type: Medicaid [place] /   CPT Code: 09146  Time:9:37-10:27  Number in Group: 8  Peer Recovery Support Specialist present during group: no    Chief Concern: Group Therapy for Substance Use Disorder      Subjective:   Cheryl [name] presents today for group therapy through the Comprehensive Opioid Addiction Treatment (COAT) program.  She reports 828 clinic days. The purpose of this group was to explore recent stressors and successes. Patients offered each other support. Therapist used motivational interviewing to facilitate the session. . She specifically discussed concerns with losing insurance, and her recent trip to [place] .    Objective:   Mood: within normal limits  Affect: congruent to mood   Appearance/Behavior: Alert, Good eye contact, Good participation, Receptive, Supportive of peers, Oriented , Dressed appropriately , and No acute distress   Participation:  active and attentive      Assessment:  Opioid Use Disorder, severe, on maintenance therapy   Other SUD Diagnoses: Tobacco Use Disorder  Procedure:   This is a Monthly  process and psychoeducational group designed to be appropriate to individuals with opioid use disorder in recovery. The focus of this group is to educate about the process of building a sound recovery program, to facilitate the understanding of the disease of addiction and how Medication Assisted Treatment (MAT) works to aid in the  development of recovery, and to support the practice of new recovery tools. Attendance and experience at peer recovery meetings was reviewed. Patients were encouraged to identify triggers for relapse, employ relapse prevention strategies and to develop a supportive social network.    Plan:  Maintain abstinence, follow recommendations of Treatment Team, Continue attending the COAT Clinic group and take medication as prescribed. Attend the encouraged peer recovery meetings.    Alfonso Gal, LGSW  01/20/2024, 10:52    I have read and reviewed this document. Alan Snipe, LICSW

## 2024-02-16 ENCOUNTER — Telehealth: Payer: MEDICAID | Admitting: Clinical

## 2024-02-16 ENCOUNTER — Encounter (INDEPENDENT_AMBULATORY_CARE_PROVIDER_SITE_OTHER): Payer: Self-pay

## 2024-02-16 ENCOUNTER — Telehealth (INDEPENDENT_AMBULATORY_CARE_PROVIDER_SITE_OTHER): Payer: MEDICAID | Admitting: Emergency Medicine

## 2024-02-16 ENCOUNTER — Other Ambulatory Visit: Payer: Self-pay

## 2024-02-16 DIAGNOSIS — F1121 Opioid dependence, in remission: Secondary | ICD-10-CM

## 2024-02-16 DIAGNOSIS — F1120 Opioid dependence, uncomplicated: Secondary | ICD-10-CM

## 2024-02-16 DIAGNOSIS — Z720 Tobacco use: Secondary | ICD-10-CM

## 2024-02-16 MED ORDER — BUPRENORPHINE 8 MG-NALOXONE 2 MG SUBLINGUAL FILM: 1.0000 | ORAL_FILM | Freq: Two times a day (BID) | SUBLINGUAL | 0 refills | Status: DC

## 2024-02-16 NOTE — Progress Notes (Addendum)
 [facility]  Department of Behavioral Medicine Outpatient  Comprehensive Opioid Addiction Treatment (COAT) Program  Monthly Group Progress Note    TELEMEDICINE DOCUMENTATION:    Patient Location:  Zoom Visit from [address] [address]  Patient/family aware of provider location:  Yes  Patient/family consent for telemedicine:  Yes  Examination observed and performed by: Ozell Hock, MD    Video Visit, via Zoom      Patient Name:  Cheryl [name]  MRN: [id number]  Date: 02/16/2024   Payor Source: Payor: [employer] - [place] / Plan: [employer] - [place] / Product Type: Medicaid [place] /   Peer Recovery Support Specialist present during group: no    Chief Concern: Follow up Med Check for Substance Use Disorder    Current MOUD:  Buprenorphine  Dose: 16 mg/daily       Subjective:    Patient reports 884 clinic days.  Everything is good, mom is living with her, better in some ways and worse in others.  Family is coming over to help with her care.   Sobriety doing well.  No other concerns.    Cravings: None  Withdrawal Symptoms: None  Side Effects: none    Motivation: Good      Objective:  Mood: within normal limits    Affect: stable     Speech: normal rate/ clear/ coherent    Thought process: Goal directed/Linear     Attention: maintained and interactive     Appearance: Alert, Receptive, Oriented , and No acute distress      Insight: Good      Urine drug screen:      07/10/2021     9:00 AM 08/31/2021     2:00 PM 10/05/2021     9:00 AM 11/27/2021    12:47 PM 12/28/2021     1:00 PM 02/15/2022     9:00 AM 05/09/2022    12:00 PM   Drug Screen Results   Amphetamine (AMP) Negative Negative Negative Negative Negative Negative Negative   Barbiturates (BAR) Negative Negative Negative Negative Negative Negative Negative   BUP - Cut off Levels 10 ng/ml Positive Positive Positive Positive Positive Positive Positive   Benzodiazepine (BZO) Negative Negative Negative Negative Negative Negative Negative   Cocaine (COC)  Negative Negative Negative Negative Negative Negative Negative   Methamphetamine (MET) Negative Negative Negative Negative Negative Negative Negative   Methadone (MTD) Negative Negative Negative Negative Negative Negative Negative   Oxycodone (OXY) Negative Negative Negative Negative Negative Negative Negative   Marijuana (THC) Negative Negative Negative Negative Negative Negative Negative   Ecstasy (MDMA) Negative Negative Negative Negative Negative Negative Negative   Phencyclidine (PCP) Negative Negative Negative Negative Negative Negative Negative   Tricyclic Antidepressant (TCA)   Negative Negative Negative Negative Negative   Temperature within range? yes yes yes yes yes yes yes   Observed no yes no no no yes no   Tester Golda Golda Jk AK Ciara Ciara Ciara   Physician [address]   Lot # [id number] [phone] [id number] [phone] [id number] [phone] [id number]   Expiration Date 06/20/2022 06/20/2022 04/27/2023 04/27/23 04/20/2023 04/20/2023 09/04/2023   Internal Control Valid yes yes yes yes yes yes yes   Initials CND CND Jk ak CND CND CND       Assessment:  Opioid Use Disorder, in sustained remission, on maintenance therapy     Other SUD Diagnoses: None  Procedure: Patient was seen in the COAT Clinic in the  Department of Behavioral Medicine and Psychiatry in [place], [place]. This clinic visit included medication management, addiction education, and evaluation of progress in recovery in a group setting. Patient's progress, psychosocial functioning and treatment response was discussed during the treatment team meeting with the case manager and/or therapist to assist in medical decision-making.   Plan:  Return for next scheduled appointment in COAT Clinic  Continue recovery work, including peer recovery meetings.  Continue Current MOUD: Continue previously prescribed  8/2 mg Buprenorphine -naloxone  (Suboxone ) Films , Buprenorphine  Dose:16 mg/daily .    Ozell Hock,  MD

## 2024-02-16 NOTE — Nursing Note (Signed)
 Person notified to present to [facility], [place], [place], or approved outside location by EOD on 02/18/24 for RANDOM (not selected for observation) urine drug screen.  Pt to be sent out for ETGR, GABA, BUP, and FENT    Pt reported 856 with No Relapse      Patient was educated to contact case manager to inquire about [facility] Medicine affiliate locations.    Golda Rough, MA  02/16/2024, 08:39

## 2024-02-17 NOTE — Psychotherapy Note (Signed)
 [facility]  Department of Behavioral Medicine Outpatient  Comprehensive Opioid Addiction Treatment (COAT) Program  Monthly Group Therapy Note    TELEMEDICINE DOCUMENTATION:    Patient Location: [address] [address]   Patient/family aware of provider location:  Yes  Patient/family consent for telemedicine:  Yes  Examination observed and performed by: Alfonso Gal, LGSW      Video Visit, via ZOOM    Patient Name:  Cheryl [name]  MRN: [id number]  Date: 02/16/2024   Payor Source: Payor: [employer] - [place] / Plan: [employer]TER [employer] - [place] / Product Type: Medicaid MC /   CPT Code: 09146  Time:9:23-10:18  Number in Group: 7  Peer Recovery Support Specialist present during group: no    Chief Concern: Group Therapy for Substance Use Disorder      Subjective:   Cheryl [name] presents today for group therapy through the Comprehensive Opioid Addiction Treatment (COAT) program.  She reports 856 clinic days. The purpose of this group was to explore and process recent stressors, share recent successes, and discuss holidays. Therapist used motivational interviewing to facilitate group.  . She specifically discussed helping care for her mother, concerns that this could be her mother's last Christmas, and how she is coping with the caregiver role.    Objective:   Mood: within normal limits  Affect: congruent to mood   Appearance/Behavior: Alert, Good eye contact, Good participation, Receptive, Supportive of peers, Oriented , Dressed appropriately , and No acute distress   Participation:  active and attentive      Assessment:  Opioid Use Disorder, severe, on maintenance therapy   Other SUD Diagnoses: Tobacco Use Disorder    (F11.20) Opioid use disorder, severe, on maintenance therapy  (primary encounter diagnosis)      Procedure:   This is a Monthly  process and psychoeducational group designed to be appropriate to individuals with opioid use disorder in recovery. The focus of this group is to educate about the  process of building a sound recovery program, to facilitate the understanding of the disease of addiction and how Medication Assisted Treatment (MAT) works to aid in the development of recovery, and to support the practice of new recovery tools. Attendance and experience at peer recovery meetings was reviewed. Patients were encouraged to identify triggers for relapse, employ relapse prevention strategies and to develop a supportive social network.    Plan:  Maintain abstinence, follow recommendations of Treatment Team, Continue attending the COAT Clinic group and take medication as prescribed. Attend the encouraged peer recovery meetings.    Alfonso Gal, LGSW  02/17/2024, 11:39      I have read and reviewed this document. Alan Snipe, LICSW

## 2024-02-18 ENCOUNTER — Other Ambulatory Visit: Payer: Self-pay

## 2024-02-18 ENCOUNTER — Other Ambulatory Visit: Payer: MEDICAID | Admitting: Emergency Medicine

## 2024-02-18 ENCOUNTER — Ambulatory Visit: Payer: MEDICAID | Attending: Emergency Medicine

## 2024-02-18 DIAGNOSIS — F1120 Opioid dependence, uncomplicated: Secondary | ICD-10-CM | POA: Insufficient documentation

## 2024-02-19 LAB — DRUG SCREEN, WITH CONFIRMATION, URINE
AMPHETAMINES URINE: NEGATIVE
BARBITURATES URINE: NEGATIVE
BENZODIAZEPINES URINE: NEGATIVE
BUPRENORPHINE URINE: POSITIVE — AB
CANNABINOIDS URINE: NEGATIVE
COCAINE METABOLITES URINE: NEGATIVE
FENTANYL, URINE: NEGATIVE
METHADONE URINE: NEGATIVE
OPIATES URINE: NEGATIVE
OXYCODONE URINE: NEGATIVE
PCP URINE: NEGATIVE

## 2024-02-20 LAB — CREATININE URINE, RANDOM: CREATININE RANDOM URINE: 262 mg/dL — ABNORMAL HIGH (ref 50.00–100.00)

## 2024-02-22 LAB — NOTES AND COMMENTS

## 2024-02-22 LAB — DRUG MONITORING, GABAPENTIN, QUANTITATIVE, URINE: GABAPENTIN: NEGATIVE ng/mL (ref <1000)

## 2024-02-23 LAB — SUBOXONE CONFIRMATORY/DEFINITIVE, URINE, BY LC-MS/MS (PERFORMABLE)
BUPRENORPHINE: 544 ng/mL — ABNORMAL HIGH (ref <10)
NALOXONE: >1000 ng/mL — ABNORMAL HIGH (ref <5)
NORBUPRENORPHINE: >1000 ng/mL — ABNORMAL HIGH (ref <10)

## 2024-02-23 LAB — FENTANYL CONFIRMATORY/DEFINITIVE, URINE, BY LC-MS/MS (PERFORMABLE)
FENTANYL INTERPRETATION: NEGATIVE
FENTANYL: NOT DETECTED ng/mL (ref <0.5)
NORFENTANYL: NOT DETECTED ng/mL (ref <2)

## 2024-02-25 LAB — ETHYL GLUCURONIDE & ETHYL SULFATE (ALCOHOL METABOLITES),URINE
CREATININE RANDOM URINE: 262 mg/dL (ref >=20.00)
ETHYL GLUCURONIDE: NEGATIVE
ETHYL SULFATE: NEGATIVE

## 2024-02-25 LAB — ETHYL GLUCURONIDE AND ETHYL SULFATE, URINE
ETHYL GLUCURONIDE: NEGATIVE
ETHYL SULFATE: NEGATIVE

## 2024-03-15 ENCOUNTER — Telehealth: Payer: MEDICAID | Admitting: Clinical

## 2024-03-15 ENCOUNTER — Telehealth: Payer: MEDICAID | Admitting: Emergency Medicine

## 2024-03-15 ENCOUNTER — Other Ambulatory Visit: Payer: Self-pay

## 2024-03-15 DIAGNOSIS — F1120 Opioid dependence, uncomplicated: Secondary | ICD-10-CM

## 2024-03-15 DIAGNOSIS — F1121 Opioid dependence, in remission: Secondary | ICD-10-CM

## 2024-03-15 DIAGNOSIS — Z720 Tobacco use: Secondary | ICD-10-CM

## 2024-03-15 MED ORDER — BUPRENORPHINE 8 MG-NALOXONE 2 MG SUBLINGUAL FILM: 1.0000 | ORAL_FILM | Freq: Two times a day (BID) | SUBLINGUAL | 0 refills | Status: DC

## 2024-03-15 NOTE — Progress Notes (Signed)
 [facility]  Department of Behavioral Medicine Outpatient  Comprehensive Opioid Addiction Treatment (COAT) Program  Monthly Group Progress Note    TELEMEDICINE DOCUMENTATION:    Patient Location:  Zoom Visit from [address] [address]  Patient/family aware of provider location:  Yes  Patient/family consent for telemedicine:  Yes  Examination observed and performed by: Ozell Hock, MD    Video Visit, via Zoom      Patient Name:  Cheryl [name]  MRN: [id number]  Date: 03/15/2024   Payor Source: Payor: [employer] - [place] / Plan: [employer] - [place] / Product Type: Medicaid MC /   Peer Recovery Support Specialist present during group: no    Chief Concern: Follow up Med Check for Substance Use Disorder    Current MOUD:  Buprenorphine  Dose: 16 mg/daily       Subjective:    Patient reports 884 clinic days.  Recovery is going well, has a stomach virus and is on day 1, son was sick with same thing for 5 days.    Sobriety doing well.  No other concerns.    Cravings: None  Withdrawal Symptoms: None  Side Effects: none    Motivation: Good      Objective:  Mood: within normal limits    Affect: stable     Speech: normal rate/ clear/ coherent    Thought process: Goal directed/Linear     Attention: maintained and interactive     Appearance: Alert, Receptive, Oriented , and No acute distress      Insight: Good      Urine drug screen:      07/10/2021     9:00 AM 08/31/2021     2:00 PM 10/05/2021     9:00 AM 11/27/2021    12:47 PM 12/28/2021     1:00 PM 02/15/2022     9:00 AM 05/09/2022    12:00 PM   Drug Screen Results   Amphetamine (AMP) Negative Negative Negative Negative Negative Negative Negative   Barbiturates (BAR) Negative Negative Negative Negative Negative Negative Negative   BUP - Cut off Levels 10 ng/ml Positive Positive Positive Positive Positive Positive Positive   Benzodiazepine (BZO) Negative Negative Negative Negative Negative Negative Negative   Cocaine (COC) Negative Negative Negative Negative  Negative Negative Negative   Methamphetamine (MET) Negative Negative Negative Negative Negative Negative Negative   Methadone (MTD) Negative Negative Negative Negative Negative Negative Negative   Oxycodone (OXY) Negative Negative Negative Negative Negative Negative Negative   Marijuana (THC) Negative Negative Negative Negative Negative Negative Negative   Ecstasy (MDMA) Negative Negative Negative Negative Negative Negative Negative   Phencyclidine (PCP) Negative Negative Negative Negative Negative Negative Negative   Tricyclic Antidepressant (TCA)   Negative Negative Negative Negative Negative   Temperature within range? yes yes yes yes yes yes yes   Observed no yes no no no yes no   Tester Golda Golda Jk AK Ciara Ciara Ciara   Physician [address]   Lot # [id number] [phone] [id number] [phone] [id number] [phone] [id number]   Expiration Date 06/20/2022 06/20/2022 04/27/2023 04/27/23 04/20/2023 04/20/2023 09/04/2023   Internal Control Valid yes yes yes yes yes yes yes   Initials CND CND Jk ak CND CND CND       Assessment:  Opioid Use Disorder, in sustained remission, on maintenance therapy     Other SUD Diagnoses: None  Procedure: Patient was seen in the COAT Clinic in the Department of Behavioral  Medicine and Psychiatry in [place], [place]. This clinic visit included medication management, addiction education, and evaluation of progress in recovery in a group setting. Patient's progress, psychosocial functioning and treatment response was discussed during the treatment team meeting with the case manager and/or therapist to assist in medical decision-making.   Plan:  Return for next scheduled appointment in COAT Clinic  Continue recovery work, including peer recovery meetings.  Continue Current MOUD: Continue previously prescribed  8/2 mg Buprenorphine -naloxone  (Suboxone ) Films , Buprenorphine  Dose:16 mg/daily .    Ozell Hock, MD

## 2024-03-17 NOTE — Psychotherapy Note (Signed)
 [facility]  Department of Behavioral Medicine Outpatient  Comprehensive Opioid Addiction Treatment (COAT) Program  Monthly Group Therapy Note    TELEMEDICINE DOCUMENTATION:    Patient Location: [address] [address]   Patient/family aware of provider location:  Yes  Patient/family consent for telemedicine:  Yes  Examination observed and performed by: Alfonso Gal, LGSW      Video Visit, via ZOOM    Patient Name:  Cheryl [name]  MRN: [id number]  Date: 03/15/2024   Payor Source: Payor: [employer] - [place] / Plan: [employer] BETTER [employer] -  / Product Type: Medicaid MC /   CPT Code: 09146  Time:9:28-10:19  Number in Group: 6  Peer Recovery Support Specialist present during group: no    Chief Concern: Group Therapy for Substance Use Disorder      Subjective:   [name] presents today for group therapy through the Comprehensive Opioid Addiction Treatment (COAT) program.  She reports 884 clinic days. The purpose of this group was to explore and process recent stressors, share recent successes, and discuss holidays. Therapist used motivational interviewing to facilitate group.   . She specifically discussed not feeling well, fearing she got a stomach virus from her family.    Objective:   Mood: tired  Affect: congruent to mood   Appearance/Behavior: Low energy, Moderate participation, Quiet, Oriented , Dressed appropriately , and No acute distress   Participation:  attentive but spoke little      Assessment:  Opioid Use Disorder, severe, on maintenance therapy   Other SUD Diagnoses: Tobacco Use Disorder      ICD-10-CM    1. Opioid use disorder, severe, on maintenance therapy  F11.20           Procedure:   This is a Monthly  process and psychoeducational group designed to be appropriate to individuals with opioid use disorder in recovery. The focus of this group is to educate about the process of building a sound recovery program, to facilitate the understanding of the disease of addiction and how Medication  Assisted Treatment (MAT) works to aid in the development of recovery, and to support the practice of new recovery tools. Attendance and experience at peer recovery meetings was reviewed. Patients were encouraged to identify triggers for relapse, employ relapse prevention strategies and to develop a supportive social network.    Plan:  Maintain abstinence, follow recommendations of Treatment Team, Continue attending the COAT Clinic group and take medication as prescribed. Attend the encouraged peer recovery meetings.    Alfonso Gal, LGSW  03/17/2024, 12:14        I have read and reviewed this document. Alan Snipe, LICSW

## 2024-04-12 ENCOUNTER — Telehealth: Payer: MEDICAID | Admitting: Emergency Medicine

## 2024-04-12 ENCOUNTER — Telehealth: Payer: MEDICAID | Admitting: Clinical

## 2024-04-12 ENCOUNTER — Other Ambulatory Visit: Payer: Self-pay

## 2024-04-12 DIAGNOSIS — F1191 Opioid use, unspecified, in remission: Secondary | ICD-10-CM

## 2024-04-12 DIAGNOSIS — F1120 Opioid dependence, uncomplicated: Secondary | ICD-10-CM

## 2024-04-12 MED ORDER — BUPRENORPHINE 8 MG-NALOXONE 2 MG SUBLINGUAL FILM: 1.0000 | ORAL_FILM | Freq: Two times a day (BID) | SUBLINGUAL | 0 refills | Status: DC

## 2024-04-12 NOTE — Progress Notes (Signed)
 [facility]  Department of Behavioral Medicine Outpatient  Comprehensive Opioid Addiction Treatment (COAT) Program  Monthly Group Progress Note    TELEMEDICINE DOCUMENTATION:    Patient Location:  Zoom Visit from [address] [address]  Patient/family aware of provider location:  Yes  Patient/family consent for telemedicine:  Yes  Examination observed and performed by: Ozell Hock, MD    Video Visit, via Zoom      Patient Name:  Cheryl [name]  MRN: [id number]  Date: 04/12/2024   Payor Source: Payor: [employer] - [place] / Plan: [employer] - [place] / Product Type: Medicaid MC /   Peer Recovery Support Specialist present during group: no    Chief Concern: Follow up Med Check for Substance Use Disorder    Current MOUD:  Buprenorphine  Dose: 16 mg/daily       Subjective:    Patient reports 912 clinic days.  Cat is climbing xmas tree, tore it up.  Hopes to take a vacation somewhere in the next couple of months.    Sobriety doing well.  No other concerns.    Cravings: None  Withdrawal Symptoms: None  Side Effects: none    Motivation: Good      Objective:  Mood: within normal limits    Affect: stable     Speech: normal rate/ clear/ coherent    Thought process: Goal directed/Linear     Attention: maintained and interactive     Appearance: Alert, Receptive, Oriented , and No acute distress      Insight: Good      Urine drug screen:      07/10/2021     9:00 AM 08/31/2021     2:00 PM 10/05/2021     9:00 AM 11/27/2021    12:47 PM 12/28/2021     1:00 PM 02/15/2022     9:00 AM 05/09/2022    12:00 PM   Drug Screen Results   Amphetamine (AMP) Negative Negative Negative Negative Negative Negative Negative   Barbiturates (BAR) Negative Negative Negative Negative Negative Negative Negative   BUP - Cut off Levels 10 ng/ml Positive Positive Positive Positive Positive Positive Positive   Benzodiazepine (BZO) Negative Negative Negative Negative Negative Negative Negative   Cocaine (COC) Negative Negative Negative Negative  Negative Negative Negative   Methamphetamine (MET) Negative Negative Negative Negative Negative Negative Negative   Methadone (MTD) Negative Negative Negative Negative Negative Negative Negative   Oxycodone (OXY) Negative Negative Negative Negative Negative Negative Negative   Marijuana (THC) Negative Negative Negative Negative Negative Negative Negative   Ecstasy (MDMA) Negative Negative Negative Negative Negative Negative Negative   Phencyclidine (PCP) Negative Negative Negative Negative Negative Negative Negative   Tricyclic Antidepressant (TCA)   Negative Negative Negative Negative Negative   Temperature within range? yes yes yes yes yes yes yes   Observed no yes no no no yes no   Tester Golda Golda Jk AK Ciara Ciara Ciara   Physician [address]   Lot # [id number] [phone] [id number] [phone] [id number]   Expiration Date 06/20/2022 06/20/2022 04/27/2023 04/27/23 04/20/2023 04/20/2023 09/04/2023   Internal Control Valid yes yes yes yes yes yes yes   Initials CND CND Jk ak CND CND CND       Assessment:  Opioid Use Disorder, in sustained remission, on maintenance therapy     Other SUD Diagnoses: None  Procedure: Patient was seen in the COAT Clinic in the Department of Behavioral Medicine  and Psychiatry in [place], [place]. This clinic visit included medication management, addiction education, and evaluation of progress in recovery in a group setting. Patient's progress, psychosocial functioning and treatment response was discussed during the treatment team meeting with the case manager and/or therapist to assist in medical decision-making.   Plan:  Return for next scheduled appointment in COAT Clinic  Continue recovery work, including peer recovery meetings.  Continue Current MOUD: Continue previously prescribed  8/2 mg Buprenorphine -naloxone  (Suboxone ) Films , Buprenorphine  Dose:16 mg/daily .    Ozell Hock, MD

## 2024-04-14 NOTE — Psychotherapy Note (Signed)
 [facility]  Department of Behavioral Medicine Outpatient  Comprehensive Opioid Addiction Treatment (COAT) Program  Monthly Group Therapy Note    TELEMEDICINE DOCUMENTATION:    Patient Location: [address] [address]   Patient/family aware of provider location:  Yes  Patient/family consent for telemedicine:  Yes  Examination observed and performed by: Alfonso Gal, LGSW      Video Visit, via ZOOM    Patient Name:  Cheryl [name]  MRN: [id number]  Date: 04/12/2024   Payor Source: Payor: [employer] - [place] / Plan: [employer] BETTER HE[employer] - [place] / Product Type: Medicaid MC /   CPT Code: 09146  Time:9:28-10:19  Number in Group: 7  Peer Recovery Support Specialist present during group: no    Chief Concern: Group Therapy for Substance Use Disorder      Subjective:   Cheryl [name] presents today for group therapy through the Comprehensive Opioid Addiction Treatment (COAT) program.  She reports 912 clinic days. The purpose of this group was to explore and process recent stressors, reflect on the holidays, and share any recent successes.   . She specifically discussed that her nephew had attempted to go to rehab, but was kicked out for using marijuana. She explained afterwards he was upset, and completed suicide. We discussed her thoughts and feelings regarding this.    Objective:   Mood: within normal limits  Affect: congruent to mood   Appearance/Behavior: Alert, Good eye contact, Good participation, Receptive, Supportive of peers, Oriented , Dressed appropriately , and No acute distress   Participation:  active and attentive      Assessment:  Opioid Use Disorder, severe, on maintenance therapy   Other SUD Diagnoses: Tobacco Use Disorder      ICD-10-CM    1. Opioid use disorder, severe, on maintenance therapy  F11.20           Procedure:   This is a Monthly  process and psychoeducational group designed to be appropriate to individuals with opioid use disorder in recovery. The focus of this group is to educate about  the process of building a sound recovery program, to facilitate the understanding of the disease of addiction and how Medication Assisted Treatment (MAT) works to aid in the development of recovery, and to support the practice of new recovery tools. Attendance and experience at peer recovery meetings was reviewed. Patients were encouraged to identify triggers for relapse, employ relapse prevention strategies and to develop a supportive social network.    Plan:  Maintain abstinence, follow recommendations of Treatment Team, Continue attending the COAT Clinic group and take medication as prescribed. Attend the encouraged peer recovery meetings.    Alfonso Gal, LGSW  04/14/2024, 14:53      I have read and reviewed this document. Alan Snipe, LICSW

## 2024-05-01 ENCOUNTER — Encounter (HOSPITAL_COMMUNITY): Payer: Self-pay

## 2024-05-01 NOTE — Progress Notes (Signed)
 [place]  Controlled Substance Full Name Report Report Date 05/01/2024   From 01/30/2024 To 04/30/2024 Date of Birth Jul 23, 1985 Prescription Count 3   Last Name Prindle First Name Anuhea Middle Name                                                     Patients included in report that appear to match the search criteria.   Last Name First Name Middle Name Gender Address   [place]   [address] , [place], [place], [address]                       Prescriber Name Prescriber DEA & Zip Dispenser Name Dispenser DEA & Zip Rx Written Date Rx Dispense Date  & Date Sold    Rx Number Product Name MEDD Status Strength Qty Days # of Refill Sched Payment Type   Obera Stauch [address], [place], [address]   Germaine Sharper [id number]  [facility] [id number] [phone] 04/12/2024 04/12/2024 04/13/2024 [id number] Suboxone  Film ACTIVE 8 mg/1; 2 mg/1 56 28 0/0 CIII Medicaid   Germaine Sharper [id number]  [facility] [id number] [phone] 03/15/2024 03/15/2024 03/17/2024 [id number] Suboxone  Film INACTIVE 8 mg/1; 2 mg/1 56 28 0/0 CIII Medicaid   Germaine Sharper [id number]  [facility] [id number] [phone] 02/16/2024 02/16/2024 02/17/2024 [id number] Suboxone  Film INACTIVE 8 mg/1; 2 mg/1 56 28 0/0 CIII Medicaid                                           Rosina Gin, CASE MANAGER

## 2024-05-10 ENCOUNTER — Telehealth: Payer: Self-pay | Admitting: Emergency Medicine

## 2024-05-10 ENCOUNTER — Telehealth: Payer: Self-pay | Admitting: Clinical

## 2024-05-10 ENCOUNTER — Other Ambulatory Visit: Payer: Self-pay

## 2024-05-10 ENCOUNTER — Telehealth (INDEPENDENT_AMBULATORY_CARE_PROVIDER_SITE_OTHER): Payer: Self-pay

## 2024-05-10 DIAGNOSIS — F1120 Opioid dependence, uncomplicated: Secondary | ICD-10-CM

## 2024-05-10 MED ORDER — BUPRENORPHINE 8 MG-NALOXONE 2 MG SUBLINGUAL FILM: 1.0000 | ORAL_FILM | Freq: Two times a day (BID) | SUBLINGUAL | 0 refills | Status: AC

## 2024-05-11 ENCOUNTER — Encounter (HOSPITAL_COMMUNITY): Payer: Self-pay

## 2024-05-11 ENCOUNTER — Telehealth (INDEPENDENT_AMBULATORY_CARE_PROVIDER_SITE_OTHER): Payer: Self-pay

## 2024-05-11 NOTE — Psychotherapy Note (Signed)
 [facility]  Department of Behavioral Medicine Outpatient  Comprehensive Opioid Addiction Treatment (COAT) Program  Monthly Group Therapy Note    TELEMEDICINE DOCUMENTATION:    Patient Location: [address] [address]   Patient/family aware of provider location:  Yes  Patient/family consent for telemedicine:  Yes  Examination observed and performed by: Alfonso Gal, LICSW      Video Visit, via ZOOM    Patient Name:  Cheryl [name]  MRN: [id number]  Date: 05/10/2024   Payor Source: Payor: [employer] - [place] / Plan: [employer] BETTER HEALTH - [place] / Product Type: Medicaid MC /   CPT Code: 09146  Time:9:24-10:19  Number in Group: 5  Peer Recovery Support Specialist present during group: no    Chief Concern: Group Therapy for Substance Use Disorder      Subjective:   Cheryl [name] presents today for group therapy through the Comprehensive Opioid Addiction Treatment (COAT) program.  She reports 940 clinic days. The purpose of this group was  to explore recent stressors and discuss concerns regarding finances. Members worked together to process concerns and offer each other feedback.  . She specifically discussed health concerns, and changes in finances. She explored how her finances have changed over the years, and how she feels they have impacted her.    Objective:   Mood: within normal limits  Affect: congruent to mood   Appearance/Behavior: Alert, Good eye contact, Good participation, Receptive, Supportive of peers, Oriented , Dressed appropriately , and No acute distress   Participation:  active and attentive      Assessment:  Opioid Use Disorder, severe, on maintenance therapy   Other SUD Diagnoses: Tobacco Use Disorder      ICD-10-CM    1. Opioid use disorder, severe, on maintenance therapy  F11.20           Procedure:   This is a Monthly  process and psychoeducational group designed to be appropriate to individuals with opioid use disorder in recovery. The focus of this group is to educate about the process of  building a sound recovery program, to facilitate the understanding of the disease of addiction and how Medication Assisted Treatment (MAT) works to aid in the development of recovery, and to support the practice of new recovery tools. Attendance and experience at peer recovery meetings was reviewed. Patients were encouraged to identify triggers for relapse, employ relapse prevention strategies and to develop a supportive social network.    Plan:  Maintain abstinence, follow recommendations of Treatment Team, Continue attending the COAT Clinic group and take medication as prescribed. Attend the encouraged peer recovery meetings.    Alfonso Gal, LICSW  05/11/2024, 16:46

## 2024-06-07 ENCOUNTER — Ambulatory Visit (INDEPENDENT_AMBULATORY_CARE_PROVIDER_SITE_OTHER): Payer: Self-pay | Admitting: Clinical

## 2024-06-07 ENCOUNTER — Ambulatory Visit (INDEPENDENT_AMBULATORY_CARE_PROVIDER_SITE_OTHER): Payer: Self-pay | Admitting: Emergency Medicine
